# Patient Record
Sex: Female | Born: 1965 | Race: Black or African American | Hispanic: No | Marital: Single | Smoking: Current some day smoker
Health system: Southern US, Community
[De-identification: ages and names within clinical notes are randomized; demographics above are authoritative.]

## PROBLEM LIST (undated history)

## (undated) DIAGNOSIS — D649 Anemia, unspecified: Secondary | ICD-10-CM

## (undated) DIAGNOSIS — I10 Essential (primary) hypertension: Secondary | ICD-10-CM

## (undated) HISTORY — PX: ABDOMINAL HYSTERECTOMY: SHX81

---

## 1985-12-04 HISTORY — PX: TUBAL LIGATION: SHX77

## 2004-10-01 ENCOUNTER — Emergency Department: Payer: Self-pay | Admitting: Emergency Medicine

## 2004-10-05 ENCOUNTER — Emergency Department: Payer: Self-pay | Admitting: Emergency Medicine

## 2005-03-22 ENCOUNTER — Emergency Department: Payer: Self-pay | Admitting: Emergency Medicine

## 2006-12-15 ENCOUNTER — Emergency Department: Payer: Self-pay | Admitting: Emergency Medicine

## 2007-05-09 ENCOUNTER — Emergency Department: Payer: Self-pay | Admitting: Emergency Medicine

## 2007-09-23 ENCOUNTER — Other Ambulatory Visit: Payer: Self-pay

## 2007-09-23 ENCOUNTER — Emergency Department: Payer: Self-pay | Admitting: Emergency Medicine

## 2007-09-24 ENCOUNTER — Ambulatory Visit: Payer: Self-pay | Admitting: Emergency Medicine

## 2009-03-19 ENCOUNTER — Emergency Department: Payer: Self-pay | Admitting: Emergency Medicine

## 2012-02-06 ENCOUNTER — Emergency Department: Payer: Self-pay | Admitting: Emergency Medicine

## 2012-10-20 ENCOUNTER — Emergency Department: Payer: Self-pay | Admitting: Unknown Physician Specialty

## 2012-10-20 ENCOUNTER — Emergency Department: Payer: Self-pay | Admitting: Emergency Medicine

## 2015-01-17 DIAGNOSIS — F17210 Nicotine dependence, cigarettes, uncomplicated: Secondary | ICD-10-CM | POA: Insufficient documentation

## 2015-03-18 ENCOUNTER — Inpatient Hospital Stay
Admit: 2015-03-18 | Disposition: A | Discharge status: Home / Self Care | Payer: Self-pay | Attending: Internal Medicine | Admitting: Internal Medicine

## 2015-03-18 DIAGNOSIS — I361 Nonrheumatic tricuspid (valve) insufficiency: Secondary | ICD-10-CM

## 2015-03-18 LAB — COMPREHENSIVE METABOLIC PANEL
ALBUMIN: 3.9 g/dL
ALK PHOS: 72 U/L
ALT: 9 U/L — AB
ANION GAP: 6 — AB (ref 7–16)
BILIRUBIN TOTAL: 0.6 mg/dL
BUN: 9 mg/dL
CREATININE: 0.56 mg/dL
Calcium, Total: 8.7 mg/dL — ABNORMAL LOW
Chloride: 105 mmol/L
Co2: 28 mmol/L
EGFR (African American): >60
EGFR (Non-African Amer.): >60
GLUCOSE: 96 mg/dL
Potassium: 3.4 mmol/L — ABNORMAL LOW
SGOT(AST): 9 U/L — ABNORMAL LOW
SODIUM: 139 mmol/L
TOTAL PROTEIN: 7.4 g/dL

## 2015-03-18 LAB — URINALYSIS, COMPLETE
Bilirubin,UR: NEGATIVE
Glucose,UR: NEGATIVE mg/dL (ref 0–75)
Ketone: NEGATIVE
LEUKOCYTE ESTERASE: NEGATIVE
Nitrite: NEGATIVE
PH: 7 (ref 4.5–8.0)
PROTEIN: NEGATIVE
Specific Gravity: 1.006 (ref 1.003–1.030)

## 2015-03-18 LAB — CBC
HCT: 38.2 % (ref 35.0–47.0)
HGB: 12.4 g/dL (ref 12.0–16.0)
MCH: 28.9 pg (ref 26.0–34.0)
MCHC: 32.5 g/dL (ref 32.0–36.0)
MCV: 89 fL (ref 80–100)
Platelet: 244 10*3/uL (ref 150–440)
RBC: 4.3 10*6/uL (ref 3.80–5.20)
RDW: 14.7 % — AB (ref 11.5–14.5)
WBC: 5.7 10*3/uL (ref 3.6–11.0)

## 2015-03-18 LAB — PROTIME-INR
INR: 1
Prothrombin Time: 13.3 secs

## 2015-03-18 LAB — MAGNESIUM: Magnesium: 1.8 mg/dL

## 2015-03-18 LAB — APTT: ACTIVATED PTT: 29.5 s (ref 23.6–35.9)

## 2015-03-19 LAB — TSH: Thyroid Stimulating Horm: 1.855 u[IU]/mL

## 2015-03-19 LAB — LIPID PANEL
CHOLESTEROL: 156 mg/dL
HDL Cholesterol: 51 mg/dL
Ldl Cholesterol, Calc: 72 mg/dL
Triglycerides: 167 mg/dL — ABNORMAL HIGH
VLDL CHOLESTEROL, CALC: 33 mg/dL

## 2015-03-19 LAB — HEMOGLOBIN A1C: HEMOGLOBIN A1C: 5.5 %

## 2015-04-04 NOTE — H&P (Signed)
PATIENT NAME:  Melissa Sparks, Melissa Sparks MR#:  [id number] DATE OF BIRTH:  11/21/1966  DATE OF ADMISSION:  03/18/2015  PRIMARY CARE PHYSICIAN:  None.  REFERRING PHYSICIAN:  Dr. Jacqualine Code.  CHIEF COMPLAINT:   Slurred speech, left-sided weakness.   HISTORY OF PRESENT ILLNESS:  A 49 year old African-American female with no past medical history presented to the ED with the above chief complaint.  The patient is alert, awake, oriented, in no acute distress.  The patient started to have slurred speech, left-sided weakness and numbness and tingling this morning, so she came to the ED for further evaluation.  The patient's symptoms better were better after she arrived in the ED but still had left side numbness and tingling.  The patient said that she also had headache but denies any dizziness, or incontinence, no loss of consciousness or seizure.  The patient denies any other symptoms.   PAST MEDICAL HISTORY:  None.    SOCIAL HISTORY:  Smokes 2 packs a day for many years.  Denies any alcohol drinking or illicit drugs.   FAMILY HISTORY:  Hypertension, diabetes in her parents and CVA and MI in her family.    PAST SURGICAL HISTORY:  None.    ALLERGIES:  NONE.    HOME MEDICATIONS:  Meloxicam 15 mg p.o. daily, Tylenol 500 mg 3 tablets every four hours p.r.n.   REVIEW OF SYSTEMS:   CONSTITUTIONAL:  The patient denies any fever or chills.  No but has headache.  No dizziness.  No generalized weakness.  HEENT:  Eyes - positive for double vision and blurred vision.  Positive for slurred speech and mild dysphasia.  No postnasal drip.   CARDIOVASCULAR:  No chest pain, palpitation, orthopnea, or nocturnal dyspnea.  No leg edema.  PULMONARY:  No cough, sputum, shortness of breath, or hematemesis.  GASTROINTESTINAL:  No abdominal pain, nausea, vomiting, or diarrhea.  No melena or bloody stool.  GENITOURINARY:  No dysuria, hematuria, or incontinence.  SKIN:  No rash or jaundice.  NEUROLOGIC:  Positive for slurred speech  and mild dysphasia and left-sided weakness, numbness and tingling.  No syncope, loss of consciousness, or seizure.  ENDOCRINOLOGY:  No polyuria, polydipsia, heat or cold intolerance.  HEMATOLOGY:  No easy bruising or bleeding.   PHYSICAL EXAMINATION:  VITAL SIGNS:  Temperature 98.7, blood pressure was 200/101 and decreased to 181/104, pulse 67, oxygen saturation 100% on room air.  GENERAL:  The patient is alert, awake, oriented, in no acute distress.  HEENT:  Pupils round, equal and react to light and accommodation.  Moist oral mucosa. Clear pharynx.  NECK:  Supple.  No JVD or carotid bruit.  No lymphadenopathy.  No thyromegaly.  CARDIOVASCULAR:  S1 and S2, regular rate and rhythm.  No murmurs or gallops.   PULMONARY:  Bilateral air entry.  No wheezing or rales.  No use of accessory muscle to breathe.  ABDOMEN:  Soft.  No distention or tenderness.  No organomegaly.  Bowel sounds present.  EXTREMITIES:  No edema, clubbing or cyanosis. No calf tenderness.  Bilateral pedal pulses present.  SKIN:  No rash or jaundice.  NEUROLOGY:  Alert and oriented x 3.  No focal deficit.  Power 5/5.  Sensation intact but the patient complains of left side numbness.   LABORATORY DATA:  CAT scan of head:  No intracranial mass, hemorrhage or acute infection.  Urinalysis is negative.  CBC in normal range.  Glucose 96, BUN 9, creatinine 0.56. Electrolytes are normal except potassium 3.4.  EKG showed normal  sinus rhythm at 79 BPM.   PLAN OF TREATMENT:  1.  The patient will be admitted to telemetry floor.  The patient was treated with aspirin in the ED.  I will continue aspirin, start a statin and follow up MRI of brain, echocardiograph, and carotid duplex.  2.  I will check a lipid panel and hemoglobin A1c.   3.  For tobacco abuse, the patient consulted for smoking cessation.  The patient does not want a nicotine patch but she wants a nicotine oral inhaler kit.   4.  The patient also mentioned that she probably had an  ear mass.  ENT physician suggested an MRI of brain as an outpatient.  We will follow up MRI of brain to rule out brain mass.   I discussed the patient's condition and plan of treatment with the patient and the patient's husband and mother.   TIME SPENT:  About 55 minutes.     ____________________________ Demetrios Loll, MD qc:NT D: 03/18/2015 19:33:06 ET T: 03/18/2015 20:48:06 ET JOB#: [id number]  cc: Demetrios Loll, MD, <Dictator> Demetrios Loll MD ELECTRONICALLY SIGNED 03/19/2015 15:21

## 2015-04-04 NOTE — Discharge Summary (Signed)
PATIENT NAME:  Melissa Sparks, Melissa Sparks MR#:  [id number] DATE OF BIRTH:  12-09-1965  DATE OF ADMISSION:  03/18/2015 DATE OF DISCHARGE:  03/19/2015  ADMISSION COMPLAINT: Slurred speech and left-sided weakness.   DISCHARGE DIAGNOSES: 1. Left-sided weakness, now resolved.  2. Left-sided hearing loss.  3. Empty sella seen on MRI, nonpathologic.  4. Ongoing tobacco abuse.  5. Hypertension.   CONSULTATIONS: None.   PROCEDURES: 1. CT scan of the head without contrast shows no intracranial mass, hemorrhage or focal gray-white compartment lesions. No acute appearing infarct. Mastoids appear clear.  2. MRI of the brain without contrast shows no evidence for acute infarction, hemorrhage, mass lesion, hydrocephalus or extra-axial fluid. Normal cerebral volume. No appreciable white matter disease. Flow voids are maintained throughout the carotid, basilar and vertebral arteries. There are no areas of chronic hemorrhage. There is marked empty sella with expansion. The gland is markedly flattened. Slight tethering of the optic chiasm. Normal pineal and cerebellar tonsils. Extracranial soft tissues are on remarkable for.  3. Bilateral carotid Dopplers shows velocities and wave forms within normal limits for the right carotid and the left carotid. Study is normal with no evidence of carotid stenosis in the neck.  4. A 2-D echocardiogram showed left ventricular ejection fraction of 50% to 55%, low normal global left ventricular systolic function, mild left hypertrophy, normal right ventricular size and systolic function, mild tricuspid regurgitation, normal right ventricular systolic pressures.   HISTORY OF PRESENT ILLNESS: This 49 year old, African-American woman with no past medical history presents to the Emergency Room with slurred speech and left-sided weakness. She is awake, oriented, alert and in no distress at the time of examination. The patient started to have slurred speech and left-sided weakness and tingling  the morning of admission. Her symptoms had improved by the time she arrived to the ED but she still had left-sided numbness and tingling. She also had a headache but no dizziness, incontinence or loss of consciousness, no seizure-like activity.   HOSPITAL COURSE: By problem: 1. Slurred speech and left-sided weakness: The patient was initially admitted for concerns of stroke. CT and MRI of the brain are normal. Carotid Dopplers are normal. A 2-D echocardiogram also normal. Her imaging was discussed with neurology, though neurology did not formally consult and given that she was asymptomatic on the day of discharge and all imaging was normal, it was determined that she may have had a TIA or migraine and secondary risk factor reduction would be important going forward. She was started on lisinopril and metoprolol for blood pressure control. She was advised to stop smoking. Her lipid panel was excellent so no statin medications were started. She needs to find a primary care physician and may need to consider aspirin 8 1mg  in the future.  2. Hypertension: On presentation, she was very hypertensive with an initial blood pressure of 200/100. She was started on lisinopril and metoprolol and by the time of discharge, blood pressures were well controlled.  3. Ongoing tobacco abuse: Smoking cessation counseling was provided each day during her admission. The patient reports that she is ready to quit smoking.  4. Left-sided hearing loss: The patient will follow up with her ENT who had been evaluating left-sided hearing loss. There was actually an MRI scheduled to look for possible acoustic neuroma. Unfortunately, the MRI study performed during this admission would not replace that study and she will need to continue to follow up with her EENT.  5. Empty sella: She does not exhibit any signs of panhypopituitarism.  This is likely an incidental finding on imaging to look for cause of her symptoms. This is not likely the  cause of her symptoms. There is likely no pathologic implication at this point of the empty sella.   DISCHARGE PHYSICAL EXAMINATION: VITAL SIGNS: Temperature 98.1, pulse 74, respirations 20, blood pressure 162/85, oxygenation 99% on room air.  GENERAL: No acute distress.  CARDIOVASCULAR: Regular rate and rhythm. No murmurs, rubs, or gallops.  RESPIRATORY: Lungs are clear to auscultation bilaterally with good air movement.  NEUROLOGIC: Cranial nerves II through XII grossly intact. Strength and sensation intact. Nonfocal neurologic examination.  PSYCHIATRIC: She is alert and oriented with good insight into her clinical condition.   LABORATORY DATA: Sodium 137, potassium 3.4. This was repleted prior to discharge. Chloride 105, bicarbonate 28, BUN 9, creatinine 0.56, glucose 96, calcium 8.7, magnesium 1.8. Total cholesterol 156, LDL is 72, HDL 51, triglycerides 67, nonfasting. Hemoglobin A1c 5.5. LFTs normal. TSH 1.85. White blood cells 5.7, hemoglobin 12.4, platelets 244,000, MCV is 89. UA negative for signs of infection   CONDITION ON DISCHARGE: Stable.   DISCHARGE MEDICATIONS: 1. Meloxicam 15 mg 1 tablet once a day in the morning.  2. Tylenol 500 mg 3 tablets every 4 hours as needed for pain.  3. Lisinopril 10 mg 1 tablet daily.  4. Metoprolol tartrate 25 mg 1 tablet twice a day.   DISPOSITION: The patient is being discharged to home with no further home health needs.   DIET: Low-sodium, low-fat, low-cholesterol diet.   ACTIVITY: No limitations. Physical activity is encouraged.   TIME FRAME FOR FOLLOW-UP APPOINTMENT: Primary care physician within the next 1 to 2 weeks.   ADDITIONAL DISCHARGE INSTRUCTIONS: You must stop smoking to improve your health.   TIME SPENT ON DISCHARGE: 40 minutes.     ____________________________ Earleen Newport. Volanda Napoleon, MD cpw:TT D: 03/27/2015 16:21:31 ET T: 03/27/2015 16:36:21 ET JOB#: [id number]  cc: Barnetta Chapel P. Volanda Napoleon, MD, <Dictator> Aldean Jewett  MD ELECTRONICALLY SIGNED 04/01/2015 7:34

## 2015-05-27 ENCOUNTER — Ambulatory Visit: Payer: BLUE CROSS/BLUE SHIELD | Attending: Neurology

## 2015-05-27 DIAGNOSIS — R0683 Snoring: Secondary | ICD-10-CM | POA: Diagnosis present

## 2015-05-27 DIAGNOSIS — G4733 Obstructive sleep apnea (adult) (pediatric): Secondary | ICD-10-CM | POA: Diagnosis not present

## 2015-06-08 ENCOUNTER — Encounter: Payer: Self-pay | Admitting: *Deleted

## 2015-06-08 ENCOUNTER — Other Ambulatory Visit: Payer: BLUE CROSS/BLUE SHIELD

## 2015-06-08 NOTE — Patient Instructions (Signed)
  Your procedure is scheduled on: 06-11-15 Report to [facility] To find out your arrival time please call [phone] between 1PM - 3PM on 06-10-15 (THURSDAY)  Remember: Instructions that are not followed completely may result in serious medical risk, up to and including death, or upon the discretion of your surgeon and anesthesiologist your surgery may need to be rescheduled.    _X___ 1. Do not eat food or drink liquids after midnight. No gum chewing or hard candies.     _X___ 2. No Alcohol for 24 hours before or after surgery.   ____ 3. Bring all medications with you on the day of surgery if instructed.    _X___ 4. Notify your doctor if there is any change in your medical condition     (cold, fever, infections).     Do not wear jewelry, make-up, hairpins, clips or nail polish.  Do not wear lotions, powders, or perfumes. You may wear deodorant.  Do not shave 48 hours prior to surgery. Men may shave face and neck.  Do not bring valuables to the hospital.    [facility] is not responsible for any belongings or valuables.               Contacts, dentures or bridgework may not be worn into surgery.  Leave your suitcase in the car. After surgery it may be brought to your room.  For patients admitted to the hospital, discharge time is determined by your  treatment team.   Patients discharged the day of surgery will not be allowed to drive home.   Please read over the following fact sheets that you were given:    _X___ Take these medicines the morning of surgery with A SIP OF WATER:    1. METOPROLOL  2.   3.   4.  5.  6.  ____ Fleet Enema (as directed)   ____ Use CHG Soap as directed  ____ Use inhalers on the day of surgery  ____ Stop metformin 2 days prior to surgery    ____ Take 1/2 of usual insulin dose the night before surgery and none on the morning of surgery.   __X__ Stop Coumadin/Plavix/aspirin-STOP ASPIRIN NOW  __X__ Stop  Anti-inflammatories-STOP MELOXICAM NOW-NO NSAIDS OR ASPIRIN PRODUCTS-TYLENOL OK   ____ Stop supplements until after surgery.    ____ Bring C-Pap to the hospital.

## 2015-06-10 ENCOUNTER — Encounter
Admission: RE | Admit: 2015-06-10 | Discharge: 2015-06-10 | Disposition: A | Discharge status: Home / Self Care | Payer: BLUE CROSS/BLUE SHIELD | Source: Ambulatory Visit | Attending: Obstetrics & Gynecology | Admitting: Obstetrics & Gynecology

## 2015-06-10 DIAGNOSIS — A599 Trichomoniasis, unspecified: Secondary | ICD-10-CM | POA: Diagnosis not present

## 2015-06-10 DIAGNOSIS — I10 Essential (primary) hypertension: Secondary | ICD-10-CM | POA: Diagnosis not present

## 2015-06-10 DIAGNOSIS — Z7982 Long term (current) use of aspirin: Secondary | ICD-10-CM | POA: Diagnosis not present

## 2015-06-10 DIAGNOSIS — Z87891 Personal history of nicotine dependence: Secondary | ICD-10-CM | POA: Diagnosis not present

## 2015-06-10 DIAGNOSIS — D259 Leiomyoma of uterus, unspecified: Secondary | ICD-10-CM | POA: Diagnosis not present

## 2015-06-10 DIAGNOSIS — Z9851 Tubal ligation status: Secondary | ICD-10-CM | POA: Diagnosis not present

## 2015-06-10 DIAGNOSIS — M069 Rheumatoid arthritis, unspecified: Secondary | ICD-10-CM | POA: Diagnosis not present

## 2015-06-10 DIAGNOSIS — Z79899 Other long term (current) drug therapy: Secondary | ICD-10-CM | POA: Diagnosis not present

## 2015-06-10 DIAGNOSIS — N920 Excessive and frequent menstruation with regular cycle: Secondary | ICD-10-CM | POA: Diagnosis not present

## 2015-06-10 LAB — CBC
HCT: 36.7 % (ref 35.0–47.0)
Hemoglobin: 12.3 g/dL (ref 12.0–16.0)
MCH: 29.1 pg (ref 26.0–34.0)
MCHC: 33.5 g/dL (ref 32.0–36.0)
MCV: 87 fL (ref 80.0–100.0)
PLATELETS: 229 10*3/uL (ref 150–440)
RBC: 4.22 MIL/uL (ref 3.80–5.20)
RDW: 13.6 % (ref 11.5–14.5)
WBC: 5.2 10*3/uL (ref 3.6–11.0)

## 2015-06-10 LAB — POTASSIUM: Potassium: 3.5 mmol/L (ref 3.5–5.1)

## 2015-06-10 LAB — TYPE AND SCREEN
ABO/RH(D): O POS
Antibody Screen: NEGATIVE

## 2015-06-10 LAB — PREGNANCY, URINE: PREG TEST UR: NEGATIVE

## 2015-06-11 ENCOUNTER — Ambulatory Visit: Payer: BLUE CROSS/BLUE SHIELD | Admitting: Anesthesiology

## 2015-06-11 ENCOUNTER — Ambulatory Visit
Admission: RE | Admit: 2015-06-11 | Discharge: 2015-06-11 | Disposition: A | Discharge status: Home / Self Care | Payer: BLUE CROSS/BLUE SHIELD | Source: Ambulatory Visit | Attending: Obstetrics & Gynecology | Admitting: Obstetrics & Gynecology

## 2015-06-11 ENCOUNTER — Encounter
Admission: RE | Disposition: A | Discharge status: Home / Self Care | Payer: Self-pay | Source: Ambulatory Visit | Attending: Obstetrics & Gynecology

## 2015-06-11 DIAGNOSIS — N920 Excessive and frequent menstruation with regular cycle: Secondary | ICD-10-CM | POA: Insufficient documentation

## 2015-06-11 DIAGNOSIS — Z9851 Tubal ligation status: Secondary | ICD-10-CM | POA: Insufficient documentation

## 2015-06-11 DIAGNOSIS — M069 Rheumatoid arthritis, unspecified: Secondary | ICD-10-CM | POA: Insufficient documentation

## 2015-06-11 DIAGNOSIS — A599 Trichomoniasis, unspecified: Secondary | ICD-10-CM | POA: Insufficient documentation

## 2015-06-11 DIAGNOSIS — Z79899 Other long term (current) drug therapy: Secondary | ICD-10-CM | POA: Insufficient documentation

## 2015-06-11 DIAGNOSIS — Z7982 Long term (current) use of aspirin: Secondary | ICD-10-CM | POA: Insufficient documentation

## 2015-06-11 DIAGNOSIS — D259 Leiomyoma of uterus, unspecified: Secondary | ICD-10-CM | POA: Insufficient documentation

## 2015-06-11 DIAGNOSIS — I10 Essential (primary) hypertension: Secondary | ICD-10-CM | POA: Insufficient documentation

## 2015-06-11 DIAGNOSIS — Z87891 Personal history of nicotine dependence: Secondary | ICD-10-CM | POA: Insufficient documentation

## 2015-06-11 HISTORY — DX: Essential (primary) hypertension: I10

## 2015-06-11 HISTORY — DX: Anemia, unspecified: D64.9

## 2015-06-11 HISTORY — PX: HYSTEROSCOPY WITH D & C: SHX1775

## 2015-06-11 SURGERY — DILATATION AND CURETTAGE /HYSTEROSCOPY
Anesthesia: General | Wound class: Clean Contaminated

## 2015-06-11 MED ORDER — FAMOTIDINE 20 MG PO TABS
ORAL_TABLET | ORAL | Status: DC
Start: 2015-06-11 — End: 2015-06-11
  Filled 2015-06-11: qty 1

## 2015-06-11 MED ORDER — ONDANSETRON HCL 4 MG/2ML IJ SOLN
INTRAMUSCULAR | Status: DC | PRN
  Administered 2015-06-11: 4 mg via INTRAVENOUS

## 2015-06-11 MED ORDER — LACTATED RINGERS IV SOLN
INTRAVENOUS | Status: DC
  Administered 2015-06-11 (×3): via INTRAVENOUS

## 2015-06-11 MED ORDER — FAMOTIDINE 20 MG PO TABS
20.0000 mg | ORAL_TABLET | Freq: Once | ORAL | Status: AC
  Administered 2015-06-11: 20 mg via ORAL

## 2015-06-11 MED ORDER — PROPOFOL 10 MG/ML IV BOLUS
INTRAVENOUS | Status: DC | PRN
  Administered 2015-06-11: 200 mg via INTRAVENOUS

## 2015-06-11 MED ORDER — DEXAMETHASONE SODIUM PHOSPHATE 10 MG/ML IJ SOLN
INTRAMUSCULAR | Status: DC | PRN
  Administered 2015-06-11: 10 mg via INTRAVENOUS

## 2015-06-11 MED ORDER — ONDANSETRON HCL 4 MG/2ML IJ SOLN: 4.0000 mg | Freq: Once | INTRAMUSCULAR | Status: DC | PRN

## 2015-06-11 MED ORDER — FENTANYL CITRATE (PF) 100 MCG/2ML IJ SOLN
INTRAMUSCULAR | Status: AC
  Filled 2015-06-11: qty 2

## 2015-06-11 MED ORDER — DEXMEDETOMIDINE HCL 200 MCG/2ML IV SOLN
INTRAVENOUS | Status: DC | PRN
  Administered 2015-06-11: 12 ug via INTRAVENOUS

## 2015-06-11 MED ORDER — HYDROMORPHONE HCL 1 MG/ML IJ SOLN: 0.2500 mg | INTRAMUSCULAR | Status: DC | PRN

## 2015-06-11 MED ORDER — METRONIDAZOLE IVPB CUSTOM: 1.0000 g | Freq: Once | INTRAVENOUS | Status: DC

## 2015-06-11 MED ORDER — GLYCOPYRROLATE 0.2 MG/ML IJ SOLN
INTRAMUSCULAR | Status: DC | PRN
  Administered 2015-06-11: 0.2 mg via INTRAVENOUS

## 2015-06-11 MED ORDER — FENTANYL CITRATE (PF) 100 MCG/2ML IJ SOLN
INTRAMUSCULAR | Status: DC | PRN
  Administered 2015-06-11 (×4): 25 ug via INTRAVENOUS

## 2015-06-11 MED ORDER — LIDOCAINE HCL (CARDIAC) 20 MG/ML IV SOLN
INTRAVENOUS | Status: DC | PRN
  Administered 2015-06-11: 100 mg via INTRAVENOUS

## 2015-06-11 MED ORDER — METRONIDAZOLE IN NACL 5-0.79 MG/ML-% IV SOLN
500.0000 mg | INTRAVENOUS | Status: AC
  Filled 2015-06-11 (×2): qty 100

## 2015-06-11 MED ORDER — METRONIDAZOLE IN NACL 5-0.79 MG/ML-% IV SOLN
INTRAVENOUS | Status: DC | PRN
  Administered 2015-06-11: 1000 mg via INTRAVENOUS

## 2015-06-11 MED ORDER — IBUPROFEN 600 MG PO TABS: 600.0000 mg | ORAL_TABLET | Freq: Four times a day (QID) | ORAL | Status: DC | PRN

## 2015-06-11 MED ORDER — MIDAZOLAM HCL 5 MG/5ML IJ SOLN
INTRAMUSCULAR | Status: DC | PRN
  Administered 2015-06-11: 2 mg via INTRAVENOUS

## 2015-06-11 MED ORDER — FENTANYL CITRATE (PF) 100 MCG/2ML IJ SOLN
25.0000 ug | INTRAMUSCULAR | Status: DC | PRN
  Administered 2015-06-11 (×4): 25 ug via INTRAVENOUS

## 2015-06-11 SURGICAL SUPPLY — 13 items
CANISTER SUCT 3000ML (MISCELLANEOUS) ×4 IMPLANT
CATH ROBINSON RED A/P 16FR (CATHETERS) ×4 IMPLANT
GLOVE BIOGEL PI IND STRL 6.5 (GLOVE) ×6 IMPLANT
GLOVE BIOGEL PI INDICATOR 6.5 (GLOVE) ×6
GLOVE SURG SYN 6.5 ES PF (GLOVE) ×4 IMPLANT
GOWN STRL REUS W/ TWL LRG LVL3 (GOWN DISPOSABLE) ×4 IMPLANT
GOWN STRL REUS W/TWL LRG LVL3 (GOWN DISPOSABLE) ×4
IV LACTATED RINGERS 1000ML (IV SOLUTION) ×4 IMPLANT
NS IRRIG 500ML POUR BTL (IV SOLUTION) ×4 IMPLANT
PACK DNC HYST (MISCELLANEOUS) ×4 IMPLANT
PAD OB MATERNITY 4.3X12.25 (PERSONAL CARE ITEMS) ×4 IMPLANT
TUBING CONNECTING 10 (TUBING) ×3 IMPLANT
TUBING CONNECTING 10' (TUBING) ×1

## 2015-06-11 NOTE — Anesthesia Preprocedure Evaluation (Addendum)
Anesthesia Evaluation  Patient identified by MRN, date of birth, ID band Patient awake    Reviewed: Allergy & Precautions, NPO status , Patient's Chart, lab work & pertinent test results, reviewed documented beta blocker date and time   History of Anesthesia Complications Negative for: history of anesthetic complications  Airway Mallampati: II  TM Distance: >3 FB Neck ROM: Full    Dental no notable dental hx.    Pulmonary neg pulmonary ROS, former smoker,  breath sounds clear to auscultation  Pulmonary exam normal       Cardiovascular Exercise Tolerance: Good hypertension, Pt. on medications and Pt. on home beta blockers Normal cardiovascular examRhythm:Regular Rate:Normal     Neuro/Psych negative neurological ROS  negative psych ROS   GI/Hepatic negative GI ROS, Neg liver ROS,   Endo/Other  negative endocrine ROS  Renal/GU negative Renal ROS  negative genitourinary   Musculoskeletal negative musculoskeletal ROS (+)   Abdominal   Peds negative pediatric ROS (+)  Hematology  (+) anemia ,   Anesthesia Other Findings   Reproductive/Obstetrics negative OB ROS                            Anesthesia Physical Anesthesia Plan  ASA: III  Anesthesia Plan: General   Post-op Pain Management:    Induction: Intravenous  Airway Management Planned: LMA  Additional Equipment:   Intra-op Plan:   Post-operative Plan: Extubation in OR  Informed Consent: I have reviewed the patients History and Physical, chart, labs and discussed the procedure including the risks, benefits and alternatives for the proposed anesthesia with the patient or authorized representative who has indicated his/her understanding and acceptance.   Dental advisory given  Plan Discussed with: CRNA and Surgeon  Anesthesia Plan Comments:         Anesthesia Quick Evaluation

## 2015-06-11 NOTE — Op Note (Addendum)
Operative Report Hysteroscopy, Dilation and Curettage 06/11/2015  Patient:  Melissa Sparks  49 y.o. female Preoperative diagnosis:   FIBROID UTERUS AND MENORRHAGIA Postoperative diagnosis:  fibroid uterus and menorrhagia  PROCEDURE:  Procedure(s): DILATATION & CURETTAGE/HYSTEROSCOPY  Surgeon:  Surgeon(s) and Role:    * Izela Altier C Chanler Mendonca, MD - Primary Assistant: none Anesthesia:  LMA I/O:  IN: 1000cc crystalloid, 1000mg  Flagyl, OUT: 100cc EBL, no UOP  Specimens:  Endometrial curettings Complications: None Apparent Disposition:  VS stable to PACU  Findings: Uterus, mobile, 14 week size, sounding to 14 cm; normal but large cervix, vagina, perineum.  Evidence of continued presence of trichomonas.  Indication for procedure/Consents: 49 y.o.  here for scheduled surgery for the aforementioned diagnoses.  Risks of surgery were discussed with the patient including but not limited to: bleeding which may require transfusion; infection which may require antibiotics; injury to uterus or surrounding organs; intrauterine scarring which may impair future fertility; need for additional procedures including laparotomy or laparoscopy; and other postoperative/anesthesia complications. Written informed consent was obtained.    Procedure Details:   The patient was then taken to the operating room where anesthesia was administered and was found to be adequate.  After a formal and adequate timeout was performed, she was placed in the dorsal lithotomy position and examined with the above findings. She was then prepped and draped in the sterile manner.  A speculum was then placed in the patient's vagina.  The cerivx had a gray/tan discharge on its surface and in the vaginal apex.  Betadine was used to copiously clean and irrigate the cervix and vagina.  The patient was started on Flagyl 1000mg  IV during the case. Once the vagina was cleaned, a single tooth tenaculum was applied to the anterior lip of the cervix.    The uterus was sounded to 14cm. Her cervix was serially dilated to accommodate the hysteroscope, with findings as above. A sharp curettage was then performed until there was a gritty texture in all four quadrants. The specimen was handed off to nursing.  The camera was reinserted and confirmed the uterus had been evacuated. The tenaculum was removed from the anterior lip of the cervix and the vaginal speculum was removed after noting good hemostasis. The patient tolerated the procedure well and was taken to the recovery area awake, extubated and in stable condition.  The patient will be discharged to home as per PACU criteria.  Routine postoperative instructions given. She will follow up in the clinic in two to four weeks for postoperative evaluation.  Larey Days, MD [facility] OBGYN Attending Gynecologist

## 2015-06-11 NOTE — Progress Notes (Signed)
H&P Update  Pt was last seen in my office, and complete history and physical performed.  The surgical history has been reviewed and remains accurate without interval change. The patient was re-examined and patient's physiologic condition has not changed significantly in the last 30 days.  No new pharmacological allergies or types of therapy has been initiated.  Allergies  Allergen Reactions  . Latex Rash    SENSITIVITY   @CURRENTMEDS @ Past Medical History  Diagnosis Date  . Hypertension   . Anemia    Past Surgical History  Procedure Laterality Date  . Tubal ligation  1987    BP 121/71 mmHg  Pulse 77  Temp(Src) 98.3 F (36.8 C) (Oral)  Resp 16  Ht 5\' 7"  (1.702 m)  Wt 111.585 kg (246 lb)  BMI 38.52 kg/m2  SpO2 100%  LMP 03/12/2015  NAD RRR no murmurs CTAB, no wheezing, resps unlabored +BS, soft, NTTP No c/c/e Pelvic exam deferred  The above history was confirmed with the patient. The condition still exists that makes this procedure necessary. Surgical plan includes D&C hysteroscopy as confirmed on the consent. The treatment plan remains the same, without new options for care.  The patient understands the potential benefits and risks and the consents have been signed and placed on the chart.     Larey Days, MD Attending Obstetrician Gynecologist [facility]

## 2015-06-11 NOTE — Transfer of Care (Signed)
Immediate Anesthesia Transfer of Care Note  Patient: Melissa Sparks  Procedure(s) Performed: Procedure(s): DILATATION & CURETTAGE/HYSTEROSCOPY WITH NOVASURE ABLATION (N/A)  Patient Location: PACU  Anesthesia Type:General  Level of Consciousness: alert   Airway & Oxygen Therapy: Patient Spontanous Breathing and Patient connected to face mask oxygen  Post-op Assessment: Report given to RN and Post -op Vital signs reviewed and stable  Post vital signs: Reviewed and stable  Last Vitals:  Filed Vitals:   06/11/15 1108  BP: 121/71  Pulse: 77  Temp: 36.8 C  Resp: 16    Complications: No apparent anesthesia complications

## 2015-06-11 NOTE — Anesthesia Postprocedure Evaluation (Signed)
  Anesthesia Post-op Note  Patient: Melissa Sparks  Procedure(s) Performed: Procedure(s): DILATATION AND CURETTAGE /HYSTEROSCOPY  Anesthesia type:General  Patient location: PACU  Post pain: Pain level controlled  Post assessment: Post-op Vital signs reviewed, Patient's Cardiovascular Status Stable, Respiratory Function Stable, Patent Airway and No signs of Nausea or vomiting  Post vital signs: Reviewed and stable  Last Vitals:  Filed Vitals:   06/11/15 1416  BP: 117/64  Pulse: 60  Temp:   Resp: 13    Level of consciousness: awake, alert  and patient cooperative  Complications: No apparent anesthesia complications

## 2015-06-11 NOTE — Discharge Instructions (Signed)
You should expect to have some cramping and vaginal bleeding for about a week. This should taper off and subside, much like a period. If heavy bleeding continues or gets worse, you should contact the office for an earlier appointment.   Please call the office or physician on call for fever >101, severe pain, and heavy bleeding.   [phone]   NOTHING IN THE VAGINA FOR 2 WEEKS!  AMBULATORY SURGERY  DISCHARGE INSTRUCTIONS   1) The drugs that you were given will stay in your system until tomorrow so for the next 24 hours you should not:  A) Drive an automobile B) Make any legal decisions C) Drink any alcoholic beverage   2) You may resume regular meals tomorrow.  Today it is better to start with liquids and gradually work up to solid foods.  You may eat anything you prefer, but it is better to start with liquids, then soup and crackers, and gradually work up to solid foods.   3) Please notify your doctor immediately if you have any unusual bleeding, trouble breathing, redness and pain at the surgery site, drainage, fever, or pain not relieved by medication.    4) Additional Instructions:  Please contact your physician with any problems or Same Day Surgery at [phone], Monday through Friday 6 am to 4 pm, or [place] at [facility] number at [phone].

## 2015-06-16 LAB — ABO/RH: ABO/RH(D): O POS

## 2015-06-16 LAB — SURGICAL PATHOLOGY

## 2015-06-24 ENCOUNTER — Ambulatory Visit: Payer: BLUE CROSS/BLUE SHIELD | Attending: Neurology

## 2015-06-28 ENCOUNTER — Encounter: Payer: Self-pay | Admitting: *Deleted

## 2015-06-28 ENCOUNTER — Other Ambulatory Visit: Payer: BLUE CROSS/BLUE SHIELD

## 2015-06-28 DIAGNOSIS — Z91040 Latex allergy status: Secondary | ICD-10-CM | POA: Diagnosis not present

## 2015-06-28 DIAGNOSIS — I10 Essential (primary) hypertension: Secondary | ICD-10-CM | POA: Diagnosis not present

## 2015-06-28 DIAGNOSIS — M069 Rheumatoid arthritis, unspecified: Secondary | ICD-10-CM | POA: Diagnosis not present

## 2015-06-28 DIAGNOSIS — Z9889 Other specified postprocedural states: Secondary | ICD-10-CM | POA: Diagnosis not present

## 2015-06-28 DIAGNOSIS — N800 Endometriosis of uterus: Secondary | ICD-10-CM | POA: Diagnosis not present

## 2015-06-28 DIAGNOSIS — Z791 Long term (current) use of non-steroidal anti-inflammatories (NSAID): Secondary | ICD-10-CM | POA: Diagnosis not present

## 2015-06-28 DIAGNOSIS — Z79899 Other long term (current) drug therapy: Secondary | ICD-10-CM | POA: Diagnosis not present

## 2015-06-28 DIAGNOSIS — D252 Subserosal leiomyoma of uterus: Secondary | ICD-10-CM | POA: Diagnosis not present

## 2015-06-28 DIAGNOSIS — D259 Leiomyoma of uterus, unspecified: Secondary | ICD-10-CM | POA: Diagnosis present

## 2015-06-28 DIAGNOSIS — D251 Intramural leiomyoma of uterus: Secondary | ICD-10-CM | POA: Diagnosis not present

## 2015-06-28 DIAGNOSIS — Z7982 Long term (current) use of aspirin: Secondary | ICD-10-CM | POA: Diagnosis not present

## 2015-06-28 DIAGNOSIS — N802 Endometriosis of fallopian tube: Secondary | ICD-10-CM | POA: Diagnosis not present

## 2015-06-28 DIAGNOSIS — N939 Abnormal uterine and vaginal bleeding, unspecified: Secondary | ICD-10-CM | POA: Diagnosis not present

## 2015-06-28 NOTE — Patient Instructions (Signed)
  Your procedure is scheduled on:07/06/15 Report to Day [facility]OND FLOOR To find out your arrival time please call [phone] between 1PM - 3PM on 07/05/15  Remember: Instructions that are not followed completely may result in serious medical risk, up to and including death, or upon the discretion of your surgeon and anesthesiologist your surgery may need to be rescheduled.    __X__ 1. Do not eat food or drink liquids after midnight. No gum chewing or hard candies.     __X__ 2. No Alcohol for 24 hours before or after surgery.   ____ 3. Bring all medications with you on the day of surgery if instructed.    __X__ 4. Notify your doctor if there is any change in your medical condition     (cold, fever, infections).     Do not wear jewelry, make-up, hairpins, clips or nail polish.  Do not wear lotions, powders, or perfumes. You may wear deodorant.  Do not shave 48 hours prior to surgery. Men may shave face and neck.  Do not bring valuables to the hospital.    [facility] is not responsible for any belongings or valuables.               Contacts, dentures or bridgework may not be worn into surgery.  Leave your suitcase in the car. After surgery it may be brought to your room.  For patients admitted to the hospital, discharge time is determined by your                treatment team.   Patients discharged the day of surgery will not be allowed to drive home.   Please read over the following fact sheets that you were given:   Surgical Site Infection Prevention   ____ Take these medicines the morning of surgery with A SIP OF WATER:    1. METOPROLOL  2.   3.   4.  5.  6.  ____ Fleet Enema (as directed)   ____ Use CHG Soap as directed  ____ Use inhalers on the day of surgery  ____ Stop metformin 2 days prior to surgery    ____ Take 1/2 of usual insulin dose the night before surgery and none on the morning of surgery.   __X__ Stop Coumadin/Plavix/aspirin on  STOP  ASPIRIN NOW  ____ Stop Anti-inflammatories on STOP NOW   ____ Stop supplements until after surgery.    ____ Bring C-Pap to the hospital.

## 2015-06-28 NOTE — OR Nursing (Signed)
[facility] IN OR NOTIFIED PATIENT LATEX SENSITIVE WITH HISTORY OF RAST TEST

## 2015-06-29 ENCOUNTER — Other Ambulatory Visit: Payer: BLUE CROSS/BLUE SHIELD

## 2015-07-06 ENCOUNTER — Ambulatory Visit: Payer: BLUE CROSS/BLUE SHIELD | Admitting: Anesthesiology

## 2015-07-06 ENCOUNTER — Observation Stay
Admission: RE | Admit: 2015-07-06 | Discharge: 2015-07-07 | Disposition: A | Discharge status: Home / Self Care | Payer: BLUE CROSS/BLUE SHIELD | Source: Ambulatory Visit | Attending: Obstetrics & Gynecology | Admitting: Obstetrics & Gynecology

## 2015-07-06 ENCOUNTER — Encounter
Admission: RE | Disposition: A | Discharge status: Home / Self Care | Payer: Self-pay | Source: Ambulatory Visit | Attending: Obstetrics & Gynecology

## 2015-07-06 ENCOUNTER — Encounter: Payer: Self-pay | Admitting: *Deleted

## 2015-07-06 DIAGNOSIS — D259 Leiomyoma of uterus, unspecified: Principal | ICD-10-CM | POA: Insufficient documentation

## 2015-07-06 DIAGNOSIS — D252 Subserosal leiomyoma of uterus: Secondary | ICD-10-CM | POA: Insufficient documentation

## 2015-07-06 DIAGNOSIS — N939 Abnormal uterine and vaginal bleeding, unspecified: Secondary | ICD-10-CM | POA: Insufficient documentation

## 2015-07-06 DIAGNOSIS — Z791 Long term (current) use of non-steroidal anti-inflammatories (NSAID): Secondary | ICD-10-CM | POA: Insufficient documentation

## 2015-07-06 DIAGNOSIS — Z90710 Acquired absence of both cervix and uterus: Secondary | ICD-10-CM | POA: Diagnosis present

## 2015-07-06 DIAGNOSIS — Z79899 Other long term (current) drug therapy: Secondary | ICD-10-CM | POA: Insufficient documentation

## 2015-07-06 DIAGNOSIS — D251 Intramural leiomyoma of uterus: Secondary | ICD-10-CM | POA: Insufficient documentation

## 2015-07-06 DIAGNOSIS — Z91040 Latex allergy status: Secondary | ICD-10-CM | POA: Insufficient documentation

## 2015-07-06 DIAGNOSIS — Z7982 Long term (current) use of aspirin: Secondary | ICD-10-CM | POA: Insufficient documentation

## 2015-07-06 DIAGNOSIS — I10 Essential (primary) hypertension: Secondary | ICD-10-CM | POA: Insufficient documentation

## 2015-07-06 DIAGNOSIS — N802 Endometriosis of fallopian tube: Secondary | ICD-10-CM | POA: Insufficient documentation

## 2015-07-06 DIAGNOSIS — Z9889 Other specified postprocedural states: Secondary | ICD-10-CM | POA: Insufficient documentation

## 2015-07-06 DIAGNOSIS — N800 Endometriosis of uterus: Secondary | ICD-10-CM | POA: Insufficient documentation

## 2015-07-06 DIAGNOSIS — M069 Rheumatoid arthritis, unspecified: Secondary | ICD-10-CM | POA: Insufficient documentation

## 2015-07-06 HISTORY — PX: LAPAROSCOPIC SALPINGO OOPHERECTOMY: SHX5927

## 2015-07-06 HISTORY — PX: LAPAROSCOPIC HYSTERECTOMY: SHX1926

## 2015-07-06 LAB — TYPE AND SCREEN
ABO/RH(D): O POS
Antibody Screen: NEGATIVE

## 2015-07-06 LAB — POCT PREGNANCY, URINE: Preg Test, Ur: NEGATIVE

## 2015-07-06 SURGERY — HYSTERECTOMY, TOTAL, LAPAROSCOPIC
Anesthesia: General

## 2015-07-06 SURGERY — HYSTERECTOMY, TOTAL, LAPAROSCOPIC
Anesthesia: Choice

## 2015-07-06 MED ORDER — SUCCINYLCHOLINE CHLORIDE 20 MG/ML IJ SOLN
INTRAMUSCULAR | Status: DC | PRN
  Administered 2015-07-06: 140 mg via INTRAVENOUS

## 2015-07-06 MED ORDER — FENTANYL CITRATE (PF) 100 MCG/2ML IJ SOLN
INTRAMUSCULAR | Status: DC | PRN
  Administered 2015-07-06 (×5): 50 ug via INTRAVENOUS

## 2015-07-06 MED ORDER — BUPIVACAINE HCL (PF) 0.5 % IJ SOLN
INTRAMUSCULAR | Status: AC
  Filled 2015-07-06: qty 30

## 2015-07-06 MED ORDER — NEOSTIGMINE METHYLSULFATE 10 MG/10ML IV SOLN
INTRAVENOUS | Status: DC | PRN
  Administered 2015-07-06: 4 mg via INTRAVENOUS

## 2015-07-06 MED ORDER — MENTHOL 3 MG MT LOZG: 1.0000 | LOZENGE | OROMUCOSAL | Status: DC | PRN

## 2015-07-06 MED ORDER — IBUPROFEN 600 MG PO TABS
600.0000 mg | ORAL_TABLET | Freq: Four times a day (QID) | ORAL | Status: DC
  Administered 2015-07-06 – 2015-07-07 (×3): 600 mg via ORAL
  Filled 2015-07-06 (×3): qty 1

## 2015-07-06 MED ORDER — ACETAMINOPHEN 10 MG/ML IV SOLN
INTRAVENOUS | Status: DC | PRN
  Administered 2015-07-06: 1000 mg via INTRAVENOUS

## 2015-07-06 MED ORDER — FENTANYL CITRATE (PF) 100 MCG/2ML IJ SOLN
INTRAMUSCULAR | Status: AC
  Filled 2015-07-06: qty 2

## 2015-07-06 MED ORDER — CEFAZOLIN SODIUM-DEXTROSE 2-3 GM-% IV SOLR
INTRAVENOUS | Status: AC
  Filled 2015-07-06: qty 50

## 2015-07-06 MED ORDER — ACETAMINOPHEN 325 MG PO TABS
650.0000 mg | ORAL_TABLET | ORAL | Status: DC
  Administered 2015-07-06 – 2015-07-07 (×4): 650 mg via ORAL
  Filled 2015-07-06 (×4): qty 2

## 2015-07-06 MED ORDER — LIDOCAINE HCL (CARDIAC) 20 MG/ML IV SOLN
INTRAVENOUS | Status: DC | PRN
  Administered 2015-07-06: 100 mg via INTRAVENOUS

## 2015-07-06 MED ORDER — HYDROMORPHONE HCL 1 MG/ML IJ SOLN
0.2000 mg | INTRAMUSCULAR | Status: DC | PRN
  Administered 2015-07-06: 0.2 mg via INTRAVENOUS
  Filled 2015-07-06: qty 1

## 2015-07-06 MED ORDER — LACTATED RINGERS IV SOLN
INTRAVENOUS | Status: DC
  Administered 2015-07-06 (×2): via INTRAVENOUS

## 2015-07-06 MED ORDER — ONDANSETRON HCL 4 MG/2ML IJ SOLN
INTRAMUSCULAR | Status: DC | PRN
  Administered 2015-07-06: 4 mg via INTRAVENOUS

## 2015-07-06 MED ORDER — PROPOFOL 10 MG/ML IV BOLUS
INTRAVENOUS | Status: DC | PRN
  Administered 2015-07-06: 200 mg via INTRAVENOUS

## 2015-07-06 MED ORDER — CEFAZOLIN SODIUM-DEXTROSE 2-3 GM-% IV SOLR
2.0000 g | INTRAVENOUS | Status: AC
  Administered 2015-07-06: 2 g via INTRAVENOUS

## 2015-07-06 MED ORDER — BUPIVACAINE HCL 0.5 % IJ SOLN
INTRAMUSCULAR | Status: DC | PRN
  Administered 2015-07-06: 7 mL

## 2015-07-06 MED ORDER — TRAMADOL HCL 50 MG PO TABS
50.0000 mg | ORAL_TABLET | Freq: Four times a day (QID) | ORAL | Status: DC | PRN
  Administered 2015-07-06 (×2): 50 mg via ORAL
  Filled 2015-07-06 (×2): qty 1

## 2015-07-06 MED ORDER — ONDANSETRON HCL 4 MG/2ML IJ SOLN: 4.0000 mg | Freq: Four times a day (QID) | INTRAMUSCULAR | Status: DC | PRN

## 2015-07-06 MED ORDER — MIDAZOLAM HCL 2 MG/2ML IJ SOLN
INTRAMUSCULAR | Status: DC | PRN
  Administered 2015-07-06: 2 mg via INTRAVENOUS

## 2015-07-06 MED ORDER — DEXAMETHASONE SODIUM PHOSPHATE 4 MG/ML IJ SOLN
INTRAMUSCULAR | Status: DC | PRN
  Administered 2015-07-06: 5 mg via INTRAVENOUS

## 2015-07-06 MED ORDER — FENTANYL CITRATE (PF) 100 MCG/2ML IJ SOLN
25.0000 ug | INTRAMUSCULAR | Status: DC | PRN
  Administered 2015-07-06 (×2): 50 ug via INTRAVENOUS

## 2015-07-06 MED ORDER — OXYCODONE HCL 5 MG PO TABS: 5.0000 mg | ORAL_TABLET | Freq: Once | ORAL | Status: DC | PRN

## 2015-07-06 MED ORDER — HEPARIN SODIUM (PORCINE) 5000 UNIT/ML IJ SOLN
5000.0000 [IU] | Freq: Once | INTRAMUSCULAR | Status: AC
  Administered 2015-07-06: 5000 [IU] via SUBCUTANEOUS

## 2015-07-06 MED ORDER — SIMETHICONE 80 MG PO CHEW: 80.0000 mg | CHEWABLE_TABLET | Freq: Four times a day (QID) | ORAL | Status: DC | PRN

## 2015-07-06 MED ORDER — ROCURONIUM BROMIDE 100 MG/10ML IV SOLN
INTRAVENOUS | Status: DC | PRN
  Administered 2015-07-06: 50 mg via INTRAVENOUS
  Administered 2015-07-06 (×2): 10 mg via INTRAVENOUS

## 2015-07-06 MED ORDER — HEPARIN SODIUM (PORCINE) 5000 UNIT/ML IJ SOLN
INTRAMUSCULAR | Status: AC
  Administered 2015-07-06: 5000 [IU] via SUBCUTANEOUS
  Filled 2015-07-06: qty 1

## 2015-07-06 MED ORDER — ONDANSETRON HCL 4 MG PO TABS: 4.0000 mg | ORAL_TABLET | Freq: Four times a day (QID) | ORAL | Status: DC | PRN

## 2015-07-06 MED ORDER — OXYCODONE HCL 5 MG/5ML PO SOLN
5.0000 mg | Freq: Once | ORAL | Status: DC | PRN
Start: 2015-07-06 — End: 2015-07-06

## 2015-07-06 MED ORDER — GLYCOPYRROLATE 0.2 MG/ML IJ SOLN
INTRAMUSCULAR | Status: DC | PRN
  Administered 2015-07-06: 0.6 mg via INTRAVENOUS

## 2015-07-06 MED ORDER — ACETAMINOPHEN 10 MG/ML IV SOLN
INTRAVENOUS | Status: AC
  Filled 2015-07-06: qty 100

## 2015-07-06 MED ORDER — KETOROLAC TROMETHAMINE 15 MG/ML IJ SOLN
15.0000 mg | Freq: Once | INTRAMUSCULAR | Status: AC
  Administered 2015-07-06: 15 mg via INTRAVENOUS
  Filled 2015-07-06: qty 1

## 2015-07-06 SURGICAL SUPPLY — 60 items
APPLICATOR ARISTA FLEXITIP XL (MISCELLANEOUS) ×4 IMPLANT
BAG URO DRAIN 2000ML W/SPOUT (MISCELLANEOUS) ×4 IMPLANT
BLADE SURG SZ11 CARB STEEL (BLADE) ×4 IMPLANT
CANISTER SUCT 1200ML W/VALVE (MISCELLANEOUS) ×4 IMPLANT
CATH FOLEY 2WAY  5CC 16FR (CATHETERS) ×2
CATH URTH 16FR FL 2W BLN LF (CATHETERS) ×2 IMPLANT
CHLORAPREP W/TINT 26ML (MISCELLANEOUS) ×4 IMPLANT
DEFOGGER SCOPE WARMER CLEARIFY (MISCELLANEOUS) ×4 IMPLANT
DRAPE LEGGINS SURG 28X43 STRL (DRAPES) ×4 IMPLANT
DRAPE UNDER BUTTOCK W/FLU (DRAPES) ×4 IMPLANT
DRESSING TELFA 4X3 1S ST N-ADH (GAUZE/BANDAGES/DRESSINGS) ×12 IMPLANT
DRSG TEGADERM 2-3/8X2-3/4 SM (GAUZE/BANDAGES/DRESSINGS) ×12 IMPLANT
ENDOPOUCH RETRIEVER 10 (MISCELLANEOUS) IMPLANT
GAUZE SPONGE NON-WVN 2X2 STRL (MISCELLANEOUS) ×4 IMPLANT
GLOVE BIOGEL PI IND STRL 6.5 (GLOVE) ×20 IMPLANT
GLOVE BIOGEL PI INDICATOR 6.5 (GLOVE) ×20
GLOVE SURG SYN 6.5 ES PF (GLOVE) ×16 IMPLANT
GOWN STRL REUS W/ TWL LRG LVL3 (GOWN DISPOSABLE) ×6 IMPLANT
GOWN STRL REUS W/ TWL XL LVL3 (GOWN DISPOSABLE) ×2 IMPLANT
GOWN STRL REUS W/TWL LRG LVL3 (GOWN DISPOSABLE) ×6
GOWN STRL REUS W/TWL XL LVL3 (GOWN DISPOSABLE) ×2
GRASPER SUT TROCAR 14GX15 (MISCELLANEOUS) ×4 IMPLANT
HEMOSTAT ARISTA ABSORB 3G PWDR (MISCELLANEOUS) ×4 IMPLANT
HIBICLENS CHG 4% 32OZ (MISCELLANEOUS) ×4 IMPLANT
IRRIGATION STRYKERFLOW (MISCELLANEOUS) ×2 IMPLANT
IRRIGATOR STRYKERFLOW (MISCELLANEOUS) ×4
IV LACTATED RINGERS 1000ML (IV SOLUTION) ×4 IMPLANT
JELLY LUB 2OZ STRL (MISCELLANEOUS) ×2
JELLY LUBE 2OZ STRL (MISCELLANEOUS) ×2 IMPLANT
KIT RM TURNOVER CYSTO AR (KITS) ×4 IMPLANT
LABEL OR SOLS (LABEL) ×4 IMPLANT
LIGASURE 5MM LAPAROSCOPIC (INSTRUMENTS) IMPLANT
LIGASURE BLUNT 5MM 37CM (INSTRUMENTS) ×4 IMPLANT
LIQUID BAND (GAUZE/BANDAGES/DRESSINGS) ×4 IMPLANT
MANIPULATOR UTERINE ZUMI 4.5 (MISCELLANEOUS) IMPLANT
MANIPULATOR VCARE LG CRV RETR (MISCELLANEOUS) ×4 IMPLANT
MANIPULATOR VCARE STD CRV RETR (MISCELLANEOUS) IMPLANT
NDL SAFETY 22GX1.5 (NEEDLE) ×4 IMPLANT
NEEDLE VERESS 14GA 120MM (NEEDLE) ×4 IMPLANT
NS IRRIG 500ML POUR BTL (IV SOLUTION) ×4 IMPLANT
PACK LAP CHOLECYSTECTOMY (MISCELLANEOUS) ×4 IMPLANT
PAD OB MATERNITY 4.3X12.25 (PERSONAL CARE ITEMS) ×4 IMPLANT
PAD PREP 24X41 OB/GYN DISP (PERSONAL CARE ITEMS) ×4 IMPLANT
PAD TRENDELENBURG OR TABLE (MISCELLANEOUS) ×4 IMPLANT
SCISSORS METZENBAUM CVD 33 (INSTRUMENTS) ×4 IMPLANT
SET CYSTO W/LG BORE CLAMP LF (SET/KITS/TRAYS/PACK) ×4 IMPLANT
SHEARS HARMONIC ACE PLUS 36CM (ENDOMECHANICALS) IMPLANT
SLEEVE ENDOPATH XCEL 5M (ENDOMECHANICALS) ×8 IMPLANT
SPONGE LAP 18X18 5 PK (GAUZE/BANDAGES/DRESSINGS) ×4 IMPLANT
SPONGE VERSALON 2X2 STRL (MISCELLANEOUS) ×4
SUT MNCRL AB 4-0 PS2 18 (SUTURE) ×4 IMPLANT
SUT VIC AB 0 CT1 36 (SUTURE) ×4 IMPLANT
SUT VIC AB 2-0 UR6 27 (SUTURE) IMPLANT
SUT VIC AB 4-0 PS2 18 (SUTURE) IMPLANT
SYR 50ML LL SCALE MARK (SYRINGE) ×4 IMPLANT
SYRINGE 10CC LL (SYRINGE) ×8 IMPLANT
TROCAR ENDO BLADELESS 11MM (ENDOMECHANICALS) ×4 IMPLANT
TROCAR XCEL NON-BLD 5MMX100MML (ENDOMECHANICALS) ×4 IMPLANT
TUBING INSUFFLATOR HEATED (MISCELLANEOUS) ×4 IMPLANT
TUBING INSUFFLATOR HI FLOW (MISCELLANEOUS) ×4 IMPLANT

## 2015-07-06 NOTE — Anesthesia Postprocedure Evaluation (Signed)
  Anesthesia Post-op Note  Patient: Melissa Sparks  Procedure(s) Performed: Procedure(s): HYSTERECTOMY TOTAL LAPAROSCOPIC (N/A) LAPAROSCOPIC SALPINGO OOPHORECTOMY (Bilateral)  Anesthesia type:General ETT  Patient location: PACU  Post pain: Pain level controlled  Post assessment: Post-op Vital signs reviewed, Patient's Cardiovascular Status Stable, Respiratory Function Stable, Patent Airway and No signs of Nausea or vomiting  Post vital signs: Reviewed and stable  Last Vitals:  Filed Vitals:   07/06/15 1502  BP: 114/72  Pulse: 74  Temp: 36.4 C  Resp: 20    Level of consciousness: awake, alert  and patient cooperative  Complications: No apparent anesthesia complications

## 2015-07-06 NOTE — Anesthesia Procedure Notes (Signed)
Procedure Name: Intubation Date/Time: 07/06/2015 11:11 AM Performed by: Aline Brochure Pre-anesthesia Checklist: Patient identified, Emergency Drugs available, Suction available and Patient being monitored Patient Re-evaluated:Patient Re-evaluated prior to inductionOxygen Delivery Method: Circle system utilized Preoxygenation: Pre-oxygenation with 100% oxygen Intubation Type: IV induction Ventilation: Mask ventilation without difficulty Laryngoscope Size: Mac and 3 Grade View: Grade III Tube type: Oral Tube size: 7.0 mm Number of attempts: 1 Airway Equipment and Method: Bougie stylet Placement Confirmation: positive ETCO2 and breath sounds checked- equal and bilateral Secured at: 22 cm Tube secured with: Tape Dental Injury: Teeth and Oropharynx as per pre-operative assessment

## 2015-07-06 NOTE — Progress Notes (Signed)
Upon reassessing pain level. Pt states she is at a 8 or 9. Spoke with dr.harris who is on call for Dr.ward and verbalizes to give pt 0.2 of dilaudid now and try tramadol later.

## 2015-07-06 NOTE — Transfer of Care (Signed)
Immediate Anesthesia Transfer of Care Note  Patient: Melissa Sparks  Procedure(s) Performed: Procedure(s): HYSTERECTOMY TOTAL LAPAROSCOPIC (N/A) LAPAROSCOPIC SALPINGO OOPHORECTOMY (Bilateral)  Patient Location: PACU  Anesthesia Type:General  Level of Consciousness: awake  Airway & Oxygen Therapy: Patient Spontanous Breathing and Patient connected to face mask oxygen  Post-op Assessment: Report given to RN and Post -op Vital signs reviewed and stable  Post vital signs: Reviewed and stable  Last Vitals:  Filed Vitals:   07/06/15 1349  BP: 106/75  Pulse: 65  Temp: 36.3 C  Resp: 17    Complications: No apparent anesthesia complications

## 2015-07-06 NOTE — Op Note (Signed)
Total Laparoscopic Hysterectomy Operative Note Procedure Date: 07/06/2015  Patient:  Melissa Sparks  49 y.o. female  PRE-OPERATIVE DIAGNOSIS:  Symptomatic Fibroid Uterus, Abnormal Uterine Bleeding POST-OPERATIVE DIAGNOSIS:  Same  PROCEDURE:  Procedure(s): HYSTERECTOMY TOTAL LAPAROSCOPIC (N/A) >250g LAPAROSCOPIC SALPINGO OOPHORECTOMY (Bilateral)  SURGEON:  Surgeon(s) and Role:    * Marqui Formby C Prabhav Faulkenberry, MD - Primary    * Gae Dry, MD - Assisting  ANESTHESIA:  General via ET  I/O  Total I/O In: 1000 [I.V.:1000] Out: 450 [Urine:300; Blood:150]  FINDINGS:  Large irregularly shaped fibroid uterus, normal ovaries with small left simple cyst, and normal fallopian tubes bilaterally.  Normal upper abdomen.  SPECIMEN: Uterus, Cervix, and bilateral fallopian tubes  COMPLICATIONS: none apparent  DISPOSITION: vital signs stable to PACU  Indication for Surgery: 49 y.o. who presented withsymptomatic abnormal uterine bleeding and request for hysterectomy.  Ultrasound showed multiple fibroids, too numerous to count, and patient refused EMB.  D&C was performed prior to surgery to exclude malignancy.  Risks of surgery were discussed with the patient including but not limited to: bleeding which may require transfusion or reoperation; infection which may require antibiotics; injury to bowel, bladder, ureters or other surrounding organs; need for additional procedures including laparotomy, blood clot, incisional problems and other postoperative/anesthesia complications. Written informed consent was obtained.      PROCEDURE IN DETAIL:  The patient had 5000u Heparin Sub-q and sequential compression devices applied to her lower extremities while in the preoperative area.  She was then taken to the operating room where general anesthesia was administered and was found to be adequate.  She was placed in the dorsal lithotomy position, and was prepped and draped in a sterile manner. A surgical time-out was  performed.  A Foley catheter was inserted into her bladder and attached to constant drainage and a V-Care uterine manipulator was then advanced into the uterus and a good fit around the cervix was noted. The gloves were changed, and attention was turned to the abdomen where a supraumbilical incision was made with the scalpel. A Veress needle was inserted and a drop test confirmed appropriate position.  Opening pressure was 43mmHg, and the abdomen was insufflated to 15mg Hg carbon dioxide gas and adequate pneumoperitoneum was obtained. A 97mm trochar was inserted in the incision using a visiport method. A survey of the patient's pelvis and abdomen revealed the findings as mentioned above. Two 66mm ports were inserted in the lower left and right quadrants under visualization.    The bilateral fallopian tubes were separated from the mesosalpinx using the Ligasure. The bilateral round ligaments were transected and anterior broad ligament divided and brought across the uterus to separate the vesicouterine peritoneum and create a bladder flap. The bladder was pushed away from the uterus. The bilateral uterine arteries were ligated and transected. The bilateral uterosacral and cardinal ligaments were ligated and transected. A colpotomy was made around the V-Care cervical cup and the uterus, cervix, and bilateral tubes were removed through the vagina. The vaginal cuff was closed vaginally using 0-Vicryl in a running locking stitch. This was tested for integrity using the surgeon's finger. After a change of gloves, the pneumoperitoneum was recreated and surgical site inspected, cauterized where necessary, and due to an area of persistent ooze, Arista coagulant powder was spread across the surgical bed, which was then found to be hemostatic. Bilateral ureters were visualized vermiuclating. No intraoperative injury to surrounding organs was noted. The abdomen was desufflated and all instruments were then removed.  All skin incisions were closed with 4-0 monocryl and covered with surgical glue. The patient tolerated the procedure well.  All instruments, needles, and sponge counts were correct x 2. The patient was taken to the recovery room in stable condition.   ---- Larey Days, MD Attending Obstetrician and Gynecologist [facility]

## 2015-07-06 NOTE — Progress Notes (Signed)
Received patient post op, VSS, patient has a latex allergy, IV fluids infusing as ordered, site without S/S of infiltration or infection, patient is drowsy but easily aroused for care. Medicated for pain and patient stated that pain is much better.

## 2015-07-06 NOTE — Anesthesia Preprocedure Evaluation (Signed)
Anesthesia Evaluation  Patient identified by MRN, date of birth, ID band Patient awake    Reviewed: Allergy & Precautions, H&P , NPO status , Patient's Chart, lab work & pertinent test results, reviewed documented beta blocker date and time   Airway Mallampati: III  TM Distance: >3 FB Neck ROM: full    Dental no notable dental hx. (+) Teeth Intact   Pulmonary former smoker,  breath sounds clear to auscultation  Pulmonary exam normal       Cardiovascular Exercise Tolerance: Good hypertension, Normal cardiovascular examRhythm:regular Rate:Normal     Neuro/Psych negative neurological ROS  negative psych ROS   GI/Hepatic negative GI ROS, Neg liver ROS,   Endo/Other  negative endocrine ROS  Renal/GU negative Renal ROS  negative genitourinary   Musculoskeletal   Abdominal   Peds  Hematology negative hematology ROS (+)   Anesthesia Other Findings Past Medical History:   Hypertension                                                 Anemia                                                       Signs and symptoms suggestive of sleep apnea    Reproductive/Obstetrics negative OB ROS                             Anesthesia Physical Anesthesia Plan  ASA: III  Anesthesia Plan: General ETT   Post-op Pain Management:    Induction:   Airway Management Planned:   Additional Equipment:   Intra-op Plan:   Post-operative Plan:   Informed Consent: I have reviewed the patients History and Physical, chart, labs and discussed the procedure including the risks, benefits and alternatives for the proposed anesthesia with the patient or authorized representative who has indicated his/her understanding and acceptance.   Dental Advisory Given  Plan Discussed with: Anesthesiologist, CRNA and Surgeon  Anesthesia Plan Comments:         Anesthesia Quick Evaluation

## 2015-07-06 NOTE — Progress Notes (Signed)
H&P Update  Pt was last seen in my office, and complete history and physical performed.  The surgical history has been reviewed and remains accurate without interval change. The patient was re-examined and patient's physiologic condition has not changed significantly in the last 30 days.  No new pharmacological allergies or types of therapy has been initiated.  Allergies  Allergen Reactions  . Latex Rash    SENSITIVITY   @CURRENTMEDS @ Past Medical History  Diagnosis Date  . Hypertension   . Anemia    Past Surgical History  Procedure Laterality Date  . Tubal ligation  1987  . Hysteroscopy w/d&c  06/11/2015    Procedure: DILATATION AND CURETTAGE /HYSTEROSCOPY;  Surgeon: Honor Loh Ward, MD;  Location: [facility] ORS;  Service: Gynecology;;    BP 115/53 mmHg  Pulse 65  Temp(Src) 98.3 F (36.8 C) (Oral)  Resp 16  Ht 5\' 7"  (1.702 m)  Wt 108.863 kg (240 lb)  BMI 37.58 kg/m2  SpO2 100%  LMP 06/28/2015  NAD RRR no murmurs CTAB, no wheezing, resps unlabored +BS, soft, NTTP No c/c/e Pelvic exam deferred  The above history was confirmed with the patient. The condition still exists that makes this procedure necessary. Surgical plan includes total laparoscopic hysterectomy, bilateral salpingectomy, as confirmed on the consent. The treatment plan remains the same, without new options for care.  The patient understands the potential benefits and risks and the consents have been signed and placed on the chart.     Larey Days, MD Attending Obstetrician Gynecologist [facility]

## 2015-07-06 NOTE — Discharge Summary (Signed)
Gynecology Physician Postoperative Discharge Summary  Patient ID: Melissa Sparks MRN: [id number] DOB/AGE: 1966/06/25 49 y.o.  Admit Date: 07/06/2015 Discharge Date: 07/07/2015  Preoperative Diagnoses: Symptomatic fibroid uterus, abnormal uterine bleeding  Procedures: Procedure(s) (LRB): HYSTERECTOMY TOTAL LAPAROSCOPIC (N/A) LAPAROSCOPIC SALPINGO OOPHORECTOMY (Bilateral)  CBC Latest Ref Rng 06/10/2015 03/18/2015  WBC 3.6 - 11.0 K/uL 5.2 5.7  Hemoglobin 12.0 - 16.0 g/dL 12.3 12.4  Hematocrit 35.0 - 47.0 % 36.7 38.2  Platelets 150 - 440 K/uL 229 244    Hospital Course:  Melissa Sparks is a 49 y.o. female admitted for scheduled surgery.  She underwent the procedures as mentioned above, her operation was uncomplicated. For further details about surgery, please refer to the operative report. Patient had an uncomplicated postoperative course. By time of discharge on POD#1, her pain was controlled on oral pain medications; she was ambulating, voiding without difficulty, tolerating regular diet and passing flatus. She was deemed stable for discharge to home.   Discharge Exam:  Blood pressure 106/75, pulse 65, temperature 97.4 F (36.3 C), temperature source Temporal, resp. rate 17, SpO2 100 %. General appearance: alert and no distress  Resp: clear to auscultation bilaterally, normal respiratory effort Cardio: regular rate and rhythm  GI: soft, non-tender; bowel sounds normal; no masses, no organomegaly.  Incision: C/D/I, no erythema, no drainage noted Pelvic: scant blood on pad  Extremities: extremities normal, atraumatic, no cyanosis or edema and Homans sign is negative, no sign of DVT  Discharged Condition: Stable  Disposition: 01-Home or Self Care      Discharge Instructions    Call MD for:  difficulty breathing, headache or visual disturbances    Complete by:  As directed      Call MD for:  extreme fatigue    Complete by:  As directed      Call MD for:  persistant dizziness or  light-headedness    Complete by:  As directed      Call MD for:  persistant nausea and vomiting    Complete by:  As directed      Call MD for:  redness, tenderness, or signs of infection (pain, swelling, redness, odor or green/yellow discharge around incision site)    Complete by:  As directed      Call MD for:  severe uncontrolled pain    Complete by:  As directed      Call MD for:  temperature >100.4    Complete by:  As directed      Diet - low sodium heart healthy    Complete by:  As directed      Increase activity slowly    Complete by:  As directed      No dressing needed    Complete by:  As directed             Medication List    TAKE these medications        acetaminophen 325 MG tablet  Commonly known as:  TYLENOL  Take 2 tablets (650 mg total) by mouth every 4 (four) hours.     aspirin 81 MG tablet  Take 81 mg by mouth daily.     ibuprofen 600 MG tablet  Commonly known as:  ADVIL,MOTRIN  Take 1 tablet (600 mg total) by mouth every 6 (six) hours as needed.     losartan-hydrochlorothiazide 100-25 MG per tablet  Commonly known as:  HYZAAR  Take 1 tablet by mouth daily.     meloxicam 15 MG tablet  Commonly  known as:  MOBIC  Take 15 mg by mouth daily.     metoprolol 50 MG tablet  Commonly known as:  LOPRESSOR  Take 50 mg by mouth 2 (two) times daily.     traMADol 50 MG tablet  Commonly known as:  ULTRAM  Take 1 tablet (50 mg total) by mouth every 6 (six) hours as needed for moderate pain.         Signed: ----- Larey Days, MD Attending Obstetrician and Gynecologist [facility]

## 2015-07-07 DIAGNOSIS — D259 Leiomyoma of uterus, unspecified: Secondary | ICD-10-CM | POA: Diagnosis not present

## 2015-07-07 LAB — SURGICAL PATHOLOGY

## 2015-07-07 MED ORDER — IBUPROFEN 600 MG PO TABS: 600.0000 mg | ORAL_TABLET | Freq: Four times a day (QID) | ORAL | Status: DC | PRN

## 2015-07-07 MED ORDER — TRAMADOL HCL 50 MG PO TABS: 50.0000 mg | ORAL_TABLET | Freq: Four times a day (QID) | ORAL | Status: DC | PRN

## 2015-07-07 MED ORDER — ACETAMINOPHEN 325 MG PO TABS: 650.0000 mg | ORAL_TABLET | ORAL | Status: DC

## 2015-07-07 NOTE — Discharge Instructions (Signed)
Discharge instructions:   Call office if you have any of the following: headache, visual changes, fever >100 F, chills, breast concerns, excessive vaginal bleeding, incision drainage or problems, leg pain or redness, depression or any other concerns.   Activity: Do not lift > 10 lbs for 6 weeks.  No intercourse or tampons for 8 weeks.  No driving for 1-2 weeks.   Call your doctor for increased pain or vaginal bleeding, temperature above 100.4, depression, or concerns.  No strenuous activity or heavy lifting for 6 weeks.  No intercourse, tampons, douching, or enemas for 8 weeks.  No tub baths-showers only.  No driving for 2 weeks or while taking pain medications.

## 2015-10-03 ENCOUNTER — Encounter: Payer: Self-pay | Admitting: Emergency Medicine

## 2015-10-03 ENCOUNTER — Emergency Department
Admission: EM | Admit: 2015-10-03 | Discharge: 2015-10-03 | Disposition: A | Discharge status: Home / Self Care | Payer: BLUE CROSS/BLUE SHIELD | Attending: Emergency Medicine | Admitting: Emergency Medicine

## 2015-10-03 DIAGNOSIS — R300 Dysuria: Secondary | ICD-10-CM | POA: Insufficient documentation

## 2015-10-03 DIAGNOSIS — I10 Essential (primary) hypertension: Secondary | ICD-10-CM | POA: Insufficient documentation

## 2015-10-03 DIAGNOSIS — R1030 Lower abdominal pain, unspecified: Secondary | ICD-10-CM | POA: Insufficient documentation

## 2015-10-03 DIAGNOSIS — Z87891 Personal history of nicotine dependence: Secondary | ICD-10-CM | POA: Diagnosis not present

## 2015-10-03 DIAGNOSIS — Z7982 Long term (current) use of aspirin: Secondary | ICD-10-CM | POA: Diagnosis not present

## 2015-10-03 DIAGNOSIS — H9201 Otalgia, right ear: Secondary | ICD-10-CM | POA: Insufficient documentation

## 2015-10-03 DIAGNOSIS — Z91040 Latex allergy status: Secondary | ICD-10-CM | POA: Diagnosis not present

## 2015-10-03 DIAGNOSIS — Z79899 Other long term (current) drug therapy: Secondary | ICD-10-CM | POA: Insufficient documentation

## 2015-10-03 LAB — COMPREHENSIVE METABOLIC PANEL
ALBUMIN: 3.7 g/dL (ref 3.5–5.0)
ALK PHOS: 90 U/L (ref 38–126)
ALT: 21 U/L (ref 14–54)
ANION GAP: 6 (ref 5–15)
AST: 21 U/L (ref 15–41)
BUN: 13 mg/dL (ref 6–20)
CHLORIDE: 102 mmol/L (ref 101–111)
CO2: 28 mmol/L (ref 22–32)
Calcium: 9 mg/dL (ref 8.9–10.3)
Creatinine, Ser: 0.76 mg/dL (ref 0.44–1.00)
GFR calc Af Amer: >60 mL/min (ref >60)
GFR calc non Af Amer: >60 mL/min (ref >60)
GLUCOSE: 105 mg/dL — AB (ref 65–99)
POTASSIUM: 3.3 mmol/L — AB (ref 3.5–5.1)
Sodium: 136 mmol/L (ref 135–145)
Total Bilirubin: 0.5 mg/dL (ref 0.3–1.2)
Total Protein: 7.6 g/dL (ref 6.5–8.1)

## 2015-10-03 LAB — URINALYSIS COMPLETE WITH MICROSCOPIC (ARMC ONLY)
Bilirubin Urine: NEGATIVE
Glucose, UA: NEGATIVE mg/dL
Ketones, ur: NEGATIVE mg/dL
LEUKOCYTES UA: NEGATIVE
Nitrite: NEGATIVE
PH: 7 (ref 5.0–8.0)
Protein, ur: NEGATIVE mg/dL
SPECIFIC GRAVITY, URINE: 1.01 (ref 1.005–1.030)

## 2015-10-03 LAB — CBC
HCT: 36.9 % (ref 35.0–47.0)
Hemoglobin: 12.3 g/dL (ref 12.0–16.0)
MCH: 28.5 pg (ref 26.0–34.0)
MCHC: 33.3 g/dL (ref 32.0–36.0)
MCV: 85.6 fL (ref 80.0–100.0)
Platelets: 235 10*3/uL (ref 150–440)
RBC: 4.31 MIL/uL (ref 3.80–5.20)
RDW: 14.2 % (ref 11.5–14.5)
WBC: 5.4 10*3/uL (ref 3.6–11.0)

## 2015-10-03 LAB — LIPASE, BLOOD: Lipase: 21 U/L (ref 11–51)

## 2015-10-03 MED ORDER — AMOXICILLIN 500 MG PO CAPS: 500.0000 mg | ORAL_CAPSULE | Freq: Two times a day (BID) | ORAL | Status: DC

## 2015-10-03 NOTE — ED Notes (Signed)
Pt reports lower abd pressure for past couple of days. Denies fever. Denies urinary complaints or vaginal discharge. States had a hysterectomy 2 months ago. Pt also complains of pain in  Right ear for past couple of days. States she feels like the room is spinning.

## 2015-10-03 NOTE — ED Provider Notes (Signed)
[facility] Emergency Department Provider Note  ____________________________________________  Time seen: 2 PM  I have reviewed the triage vital signs and the nursing notes.   HISTORY  Chief Complaint Abdominal Pain and Otalgia    HPI Melissa Sparks is a 49 y.o. female who presents with primary complaint of right otalgia. She also notes that she has had some pressure with urinating over the last couple of days and she is worried she may have a urinary tract infection. She denies fevers chills. No nausea no vomiting. She also states that her son died 3 days ago and she believes a lot of these symptoms may be related to that. She denies si but does report depression but does not request to speak with anyone.     Past Medical History  Diagnosis Date  . Hypertension   . Anemia     Patient Active Problem List   Diagnosis Date Noted  . S/P hysterectomy 07/06/2015    Past Surgical History  Procedure Laterality Date  . Tubal ligation  1987  . Hysteroscopy w/d&c  06/11/2015    Procedure: DILATATION AND CURETTAGE /HYSTEROSCOPY;  Surgeon: Honor Loh Ward, MD;  Location: [facility] ORS;  Service: Gynecology;;  . Laparoscopic hysterectomy N/A 07/06/2015    Procedure: HYSTERECTOMY TOTAL LAPAROSCOPIC;  Surgeon: Honor Loh Ward, MD;  Location: [facility] ORS;  Service: Gynecology;  Laterality: N/A;  . Laparoscopic salpingo oopherectomy Bilateral 07/06/2015    Procedure: LAPAROSCOPIC SALPINGO OOPHORECTOMY;  Surgeon: Honor Loh Ward, MD;  Location: [facility] ORS;  Service: Gynecology;  Laterality: Bilateral;    Current Outpatient Rx  Name  Route  Sig  Dispense  Refill  . acetaminophen (TYLENOL) 325 MG tablet   Oral   Take 2 tablets (650 mg total) by mouth every 4 (four) hours.   60 tablet      . aspirin 81 MG tablet   Oral   Take 81 mg by mouth daily.         Marland Kitchen ibuprofen (ADVIL,MOTRIN) 600 MG tablet   Oral   Take 1 tablet (600 mg total) by mouth every 6 (six) hours as  needed.   45 tablet   0   . losartan-hydrochlorothiazide (HYZAAR) 100-25 MG per tablet   Oral   Take 1 tablet by mouth daily.         . meloxicam (MOBIC) 15 MG tablet   Oral   Take 15 mg by mouth daily.         . metoprolol (LOPRESSOR) 50 MG tablet   Oral   Take 50 mg by mouth 2 (two) times daily.         . traMADol (ULTRAM) 50 MG tablet   Oral   Take 1 tablet (50 mg total) by mouth every 6 (six) hours as needed for moderate pain.   45 tablet   0     Allergies Latex  No family history on file.  Social History Social History  Substance Use Topics  . Smoking status: Former Smoker -- 2.00 packs/day for 30 years    Types: Cigarettes    Quit date: 03/09/2015  . Smokeless tobacco: Never Used  . Alcohol Use: No    Review of Systems  Constitutional: Negative for fever. Eyes: Negative for visual changes. ENT: Negative for sore throat. Right ear aching Cardiovascular: Negative for chest pain. Respiratory: Negative for shortness of breath. Gastrointestinal: Negative for abdominal pain, vomiting and diarrhea. Genitourinary: Pressure sensation when urinating Musculoskeletal: Negative for back pain. Skin: Negative  for rash. Neurological: Negative for headaches or focal weakness Psychiatric: Grieving over the loss of her son    ____________________________________________   PHYSICAL EXAM:  VITAL SIGNS: ED Triage Vitals  Enc Vitals Group     BP 10/03/15 1221 135/54 mmHg     Pulse Rate 10/03/15 1221 89     Resp 10/03/15 1221 20     Temp 10/03/15 1221 98.4 F (36.9 C)     Temp Source 10/03/15 1221 Oral     SpO2 10/03/15 1221 97 %     Weight 10/03/15 1221 250 lb (113.399 kg)     Height 10/03/15 1221 5\' 7"  (1.702 m)     Head Cir --      Peak Flow --      Pain Score 10/03/15 1230 8     Pain Loc --      Pain Edu? --      Excl. in [place]? --      Constitutional: Alert and oriented. Well appearing and in no distress. Eyes: Conjunctivae are normal.  ENT    Head: Normocephalic and atraumatic.   Mouth/Throat: Mucous membranes are moist. Ears: Dullness to right tympanic membrane, no erythema Cardiovascular: Normal rate, regular rhythm. Normal and symmetric distal pulses are present in all extremities. No murmurs, rubs, or gallops. Respiratory: Normal respiratory effort without tachypnea nor retractions. Breath sounds are clear and equal bilaterally.  Gastrointestinal: Soft and non-tender in all quadrants. No distention. There is no CVA tenderness. Genitourinary: deferred Musculoskeletal: Nontender with normal range of motion in all extremities. No lower extremity tenderness nor edema. Neurologic:  Normal speech and language. No gross focal neurologic deficits are appreciated. Skin:  Skin is warm, dry and intact. No rash noted. Psychiatric: Mood and affect are normal. Patient exhibits appropriate insight and judgment.  ____________________________________________    LABS (pertinent positives/negatives)  Labs Reviewed  COMPREHENSIVE METABOLIC PANEL - Abnormal; Notable for the following:    Potassium 3.3 (*)    Glucose, Bld 105 (*)    All other components within normal limits  LIPASE, BLOOD  CBC  URINALYSIS COMPLETEWITH MICROSCOPIC ([facility] ONLY)    ____________________________________________   EKG  None  ____________________________________________    RADIOLOGY I have personally reviewed any xrays that were ordered on this patient: None  ____________________________________________   PROCEDURES  Procedure(s) performed: none  Critical Care performed: none  ____________________________________________   INITIAL IMPRESSION / ASSESSMENT AND PLAN / ED COURSE  Pertinent labs & imaging results that were available during my care of the patient were reviewed by me and considered in my medical decision making (see chart for details).  Patient overall well-appearing. Sitting in the edge of the bed. She denies abdominal  pain at this time. She reports when she urinated she not have any discomfort in the emergency department. Primary complaint is right sided otalgia no evidence of viral infection at this time. We will check labs and urine and reevaluate    Labs and urine are normal. We will treat otalgia ____________________________________________   FINAL CLINICAL IMPRESSION(S) / ED DIAGNOSES  Final diagnoses:  Otalgia, right  Lower abdominal pain     Lavonia Drafts, MD 10/03/15 1718

## 2015-10-03 NOTE — Discharge Instructions (Signed)
Earache An earache, also called otalgia, can be caused by many things. Pain from an earache can be sharp, dull, or burning. The pain may be temporary or constant. Earaches can be caused by problems with the ear, such as infection in either the middle ear or the ear canal, injury, impacted ear wax, middle ear pressure, or a foreign body in the ear. Ear pain can also result from problems in other areas. This is called referred pain. For example, pain can come from a sore throat, a tooth infection, or problems with the jaw or the joint between the jaw and the skull (temporomandibular joint, or TMJ). The cause of an earache is not always easy to identify. Watchful waiting may be appropriate for some earaches until a clear cause of the pain can be found. HOME CARE INSTRUCTIONS Watch your condition for any changes. The following actions may help to lessen any discomfort that you are feeling:  Take medicines only as directed by your health care provider. This includes ear drops.  Apply ice to your outer ear to help reduce pain.  Put ice in a plastic bag.  Place a towel between your skin and the bag.  Leave the ice on for 20 minutes, 2-3 times per day.  Do not put anything in your ear other than medicine that is prescribed by your health care provider.  Try resting in an upright position instead of lying down. This may help to reduce pressure in the middle ear and relieve pain.  Chew gum if it helps to relieve your ear pain.  Control any allergies that you have.  Keep all follow-up visits as directed by your health care provider. This is important. SEEK MEDICAL CARE IF:  Your pain does not improve within 2 days.  You have a fever.  You have new or worsening symptoms. SEEK IMMEDIATE MEDICAL CARE IF:  You have a severe headache.  You have a stiff neck.  You have difficulty swallowing.  You have redness or swelling behind your ear.  You have drainage from your ear.  You have hearing  loss.  You feel dizzy.   This information is not intended to replace advice given to you by your health care provider. Make sure you discuss any questions you have with your health care provider.   Document Released: 07/07/2004 Document Revised: 12/11/2014 Document Reviewed: 06/21/2014 Elsevier Interactive Patient Education 2016 [employer]  

## 2016-01-04 ENCOUNTER — Encounter: Payer: Self-pay | Admitting: Emergency Medicine

## 2016-01-04 ENCOUNTER — Emergency Department
Admission: EM | Admit: 2016-01-04 | Discharge: 2016-01-04 | Disposition: A | Discharge status: Home / Self Care | Payer: BLUE CROSS/BLUE SHIELD | Attending: Emergency Medicine | Admitting: Emergency Medicine

## 2016-01-04 DIAGNOSIS — Z87891 Personal history of nicotine dependence: Secondary | ICD-10-CM | POA: Insufficient documentation

## 2016-01-04 DIAGNOSIS — Z7982 Long term (current) use of aspirin: Secondary | ICD-10-CM | POA: Diagnosis not present

## 2016-01-04 DIAGNOSIS — R519 Headache, unspecified: Secondary | ICD-10-CM

## 2016-01-04 DIAGNOSIS — Z792 Long term (current) use of antibiotics: Secondary | ICD-10-CM | POA: Insufficient documentation

## 2016-01-04 DIAGNOSIS — Z791 Long term (current) use of non-steroidal anti-inflammatories (NSAID): Secondary | ICD-10-CM | POA: Insufficient documentation

## 2016-01-04 DIAGNOSIS — R42 Dizziness and giddiness: Secondary | ICD-10-CM | POA: Insufficient documentation

## 2016-01-04 DIAGNOSIS — R51 Headache: Secondary | ICD-10-CM | POA: Insufficient documentation

## 2016-01-04 DIAGNOSIS — Z91040 Latex allergy status: Secondary | ICD-10-CM | POA: Insufficient documentation

## 2016-01-04 DIAGNOSIS — Z79899 Other long term (current) drug therapy: Secondary | ICD-10-CM | POA: Insufficient documentation

## 2016-01-04 DIAGNOSIS — I10 Essential (primary) hypertension: Secondary | ICD-10-CM | POA: Diagnosis not present

## 2016-01-04 DIAGNOSIS — R202 Paresthesia of skin: Secondary | ICD-10-CM | POA: Insufficient documentation

## 2016-01-04 LAB — CBC
HEMATOCRIT: 38.1 % (ref 35.0–47.0)
HEMOGLOBIN: 13 g/dL (ref 12.0–16.0)
MCH: 28.9 pg (ref 26.0–34.0)
MCHC: 34 g/dL (ref 32.0–36.0)
MCV: 84.9 fL (ref 80.0–100.0)
Platelets: 240 10*3/uL (ref 150–440)
RBC: 4.48 MIL/uL (ref 3.80–5.20)
RDW: 14.3 % (ref 11.5–14.5)
WBC: 4.6 10*3/uL (ref 3.6–11.0)

## 2016-01-04 LAB — BASIC METABOLIC PANEL
ANION GAP: 7 (ref 5–15)
BUN: 14 mg/dL (ref 6–20)
CALCIUM: 9.2 mg/dL (ref 8.9–10.3)
CO2: 27 mmol/L (ref 22–32)
Chloride: 104 mmol/L (ref 101–111)
Creatinine, Ser: 0.68 mg/dL (ref 0.44–1.00)
GFR calc non Af Amer: >60 mL/min (ref >60)
Glucose, Bld: 104 mg/dL — ABNORMAL HIGH (ref 65–99)
POTASSIUM: 3.6 mmol/L (ref 3.5–5.1)
Sodium: 138 mmol/L (ref 135–145)

## 2016-01-04 LAB — URINALYSIS COMPLETE WITH MICROSCOPIC (ARMC ONLY)
BILIRUBIN URINE: NEGATIVE
GLUCOSE, UA: NEGATIVE mg/dL
Ketones, ur: NEGATIVE mg/dL
LEUKOCYTES UA: NEGATIVE
NITRITE: NEGATIVE
Protein, ur: NEGATIVE mg/dL
Specific Gravity, Urine: 1.021 (ref 1.005–1.030)
WBC, UA: NONE SEEN WBC/hpf (ref 0–5)
pH: 5 (ref 5.0–8.0)

## 2016-01-04 MED ORDER — MECLIZINE HCL 25 MG PO TABS: 25.0000 mg | ORAL_TABLET | Freq: Three times a day (TID) | ORAL | Status: DC | PRN

## 2016-01-04 MED ORDER — MECLIZINE HCL 25 MG PO TABS
25.0000 mg | ORAL_TABLET | Freq: Once | ORAL | Status: AC
  Administered 2016-01-04: 25 mg via ORAL
  Filled 2016-01-04 (×2): qty 1

## 2016-01-04 NOTE — ED Provider Notes (Signed)
[facility] Emergency Department Provider Note    ____________________________________________  Time seen: ~1540  I have reviewed the triage vital signs and the nursing notes.   HISTORY  Chief Complaint Dizziness   History limited by: Not Limited   HPI Melissa Sparks is a 50 y.o. female who presents to the emergency department today because of concerns for headache and dizziness. She states that she has been having a headache for the past 3 days. It has been fairly constant. It is located in the front of her head. She states that during this time she has had intermittent episodes of dizziness. She feels like the room is spinning around her. She also had some paresthesias in her arms however states that these symptoms have improved. At the time of my examination she stated that she felt a little off but otherwise most of her symptoms had gone away. She denies any head trauma. Denies any fevers. States she had similar symptoms last year and was evaluated by a neurologist.     Past Medical History  Diagnosis Date  . Hypertension   . Anemia     Patient Active Problem List   Diagnosis Date Noted  . S/P hysterectomy 07/06/2015    Past Surgical History  Procedure Laterality Date  . Tubal ligation  1987  . Hysteroscopy w/d&c  06/11/2015    Procedure: DILATATION AND CURETTAGE /HYSTEROSCOPY;  Surgeon: Honor Loh Ward, MD;  Location: [facility] ORS;  Service: Gynecology;;  . Laparoscopic hysterectomy N/A 07/06/2015    Procedure: HYSTERECTOMY TOTAL LAPAROSCOPIC;  Surgeon: Honor Loh Ward, MD;  Location: [facility] ORS;  Service: Gynecology;  Laterality: N/A;  . Laparoscopic salpingo oopherectomy Bilateral 07/06/2015    Procedure: LAPAROSCOPIC SALPINGO OOPHORECTOMY;  Surgeon: Honor Loh Ward, MD;  Location: [facility] ORS;  Service: Gynecology;  Laterality: Bilateral;    Current Outpatient Rx  Name  Route  Sig  Dispense  Refill  . acetaminophen (TYLENOL) 325 MG tablet   Oral    Take 2 tablets (650 mg total) by mouth every 4 (four) hours.   60 tablet      . amoxicillin (AMOXIL) 500 MG capsule   Oral   Take 1 capsule (500 mg total) by mouth 2 (two) times daily.   10 capsule   0   . aspirin 81 MG tablet   Oral   Take 81 mg by mouth daily.         Marland Kitchen ibuprofen (ADVIL,MOTRIN) 600 MG tablet   Oral   Take 1 tablet (600 mg total) by mouth every 6 (six) hours as needed.   45 tablet   0   . losartan-hydrochlorothiazide (HYZAAR) 100-25 MG per tablet   Oral   Take 1 tablet by mouth daily.         . meloxicam (MOBIC) 15 MG tablet   Oral   Take 15 mg by mouth daily.         . metoprolol (LOPRESSOR) 50 MG tablet   Oral   Take 50 mg by mouth 2 (two) times daily.         . traMADol (ULTRAM) 50 MG tablet   Oral   Take 1 tablet (50 mg total) by mouth every 6 (six) hours as needed for moderate pain.   45 tablet   0     Allergies Latex  No family history on file.  Social History Social History  Substance Use Topics  . Smoking status: Former Smoker -- 2.00 packs/day for 30 years  Types: Cigarettes    Quit date: 03/09/2015  . Smokeless tobacco: Never Used  . Alcohol Use: No    Review of Systems  Constitutional: Negative for fever. Cardiovascular: Negative for chest pain. Respiratory: Negative for shortness of breath. Gastrointestinal: Negative for abdominal pain, vomiting and diarrhea. Neurological: Positive for headache.   10-point ROS otherwise negative.  ____________________________________________   PHYSICAL EXAM:  VITAL SIGNS: ED Triage Vitals  Enc Vitals Group     BP 01/04/16 0959 127/73 mmHg     Pulse Rate 01/04/16 0959 63     Resp 01/04/16 0959 16     Temp 01/04/16 0959 97.6 F (36.4 C)     Temp Source 01/04/16 0959 Oral     SpO2 01/04/16 0959 99 %     Weight 01/04/16 0959 270 lb (122.471 kg)     Height 01/04/16 0959 5\' 7"  (1.702 m)     Head Cir --      Peak Flow --      Pain Score 01/04/16 1000 7    Constitutional: Alert and oriented. Well appearing and in no distress. Eyes: Conjunctivae are normal. PERRL. Normal extraocular movements. No papilledema appreciated on funduscopic exam. ENT   Head: Normocephalic and atraumatic.   Nose: No congestion/rhinnorhea.   Mouth/Throat: Mucous membranes are moist.   Neck: No stridor. Hematological/Lymphatic/Immunilogical: No cervical lymphadenopathy. Cardiovascular: Normal rate, regular rhythm.  No murmurs, rubs, or gallops. Respiratory: Normal respiratory effort without tachypnea nor retractions. Breath sounds are clear and equal bilaterally. No wheezes/rales/rhonchi. Gastrointestinal: Soft and nontender. No distention. There is no CVA tenderness. Genitourinary: Deferred Musculoskeletal: Normal range of motion in all extremities. No joint effusions.  No lower extremity tenderness nor edema. Neurologic:  Normal speech and language. No gross focal neurologic deficits are appreciated.  Skin:  Skin is warm, dry and intact. No rash noted. Psychiatric: Mood and affect are normal. Speech and behavior are normal. Patient exhibits appropriate insight and judgment.  ____________________________________________    LABS (pertinent positives/negatives)  Labs Reviewed  BASIC METABOLIC PANEL - Abnormal; Notable for the following:    Glucose, Bld 104 (*)    All other components within normal limits  URINALYSIS COMPLETEWITH MICROSCOPIC ([facility] ONLY) - Abnormal; Notable for the following:    Color, Urine YELLOW (*)    APPearance CLEAR (*)    Hgb urine dipstick 2+ (*)    Bacteria, UA RARE (*)    Squamous Epithelial / LPF 6-30 (*)    All other components within normal limits  CBC     ____________________________________________   EKG  I, Nance Pear, attending physician, personally viewed and interpreted this EKG  EKG Time: 0959 Rate: 69 Rhythm: normal sinus rhythm Axis: normal Intervals: qtc 447 QRS: narrow, q waves V1 ST  changes: no st elevation Impression: abnormal ekg  ____________________________________________    RADIOLOGY  None  ____________________________________________   PROCEDURES  Procedure(s) performed: None  Critical Care performed: No  ____________________________________________   INITIAL IMPRESSION / ASSESSMENT AND PLAN / ED COURSE  Pertinent labs & imaging results that were available during my care of the patient were reviewed by me and considered in my medical decision making (see chart for details).  Patient presented to the emergency department today because of concerns for headache, dizziness and some paresthesias. At this time I examined the patient's symptoms had mostly resolved on their own accord. I did give the patient some meclizine which appeared to help with her symptoms according to the patient. On chart review it appears that  the patient had had a sleep study test which showed obstructive sleep apnea however the patient has not been put on a CPAP. Additionally there was some concern for pseudotumor however no papilledema. On today's exam and waxing and waning of symptoms makes me think that papilledema would be less likely at this point. Will plan on discharging with meclizine. Did discuss with the patient importance of following up with Dr. Manuella Ghazi.  ____________________________________________   FINAL CLINICAL IMPRESSION(S) / ED DIAGNOSES  Final diagnoses:  Dizziness  Headache, unspecified headache type     Nance Pear, MD 01/04/16 1656

## 2016-01-04 NOTE — ED Notes (Signed)
Says headache 3 days.  Tingling in both arms.  And abuzzing feeling.  Says she has beens ent to neurologist for pituitary problems in past.  Dizziness as well

## 2016-01-04 NOTE — Discharge Instructions (Signed)
Please seek medical attention for any high fevers, chest pain, shortness of breath, change in behavior, persistent vomiting, bloody stool or any other new or concerning symptoms. ° ° °Dizziness °Dizziness is a common problem. It is a feeling of unsteadiness or light-headedness. You may feel like you are about to faint. Dizziness can lead to injury if you stumble or fall. Anyone can become dizzy, but dizziness is more common in older adults. This condition can be caused by a number of things, including medicines, dehydration, or illness. °HOME CARE INSTRUCTIONS °Taking these steps may help with your condition: °Eating and Drinking °· Drink enough fluid to keep your urine clear or pale yellow. This helps to keep you from becoming dehydrated. Try to drink more clear fluids, such as water. °· Do not drink alcohol. °· Limit your caffeine intake if directed by your health care provider. °· Limit your salt intake if directed by your health care provider. °Activity °· Avoid making quick movements. °¨ Rise slowly from chairs and steady yourself until you feel okay. °¨ In the morning, first sit up on the side of the bed. When you feel okay, stand slowly while you hold onto something until you know that your balance is fine. °· Move your legs often if you need to stand in one place for a long time. Tighten and relax your muscles in your legs while you are standing. °· Do not drive or operate heavy machinery if you feel dizzy. °· Avoid bending down if you feel dizzy. Place items in your home so that they are easy for you to reach without leaning over. °Lifestyle °· Do not use any tobacco products, including cigarettes, chewing tobacco, or electronic cigarettes. If you need help quitting, ask your health care provider. °· Try to reduce your stress level, such as with yoga or meditation. Talk with your health care provider if you need help. °General Instructions °· Watch your dizziness for any changes. °· Take medicines only as  directed by your health care provider. Talk with your health care provider if you think that your dizziness is caused by a medicine that you are taking. °· Tell a friend or a family member that you are feeling dizzy. If he or she notices any changes in your behavior, have this person call your health care provider. °· Keep all follow-up visits as directed by your health care provider. This is important. °SEEK MEDICAL CARE IF: °· Your dizziness does not go away. °· Your dizziness or light-headedness gets worse. °· You feel nauseous. °· You have reduced hearing. °· You have new symptoms. °· You are unsteady on your feet or you feel like the room is spinning. °SEEK IMMEDIATE MEDICAL CARE IF: °· You vomit or have diarrhea and are unable to eat or drink anything. °· You have problems talking, walking, swallowing, or using your arms, hands, or legs. °· You feel generally weak. °· You are not thinking clearly or you have trouble forming sentences. It may take a friend or family member to notice this. °· You have chest pain, abdominal pain, shortness of breath, or sweating. °· Your vision changes. °· You notice any bleeding. °· You have a headache. °· You have neck pain or a stiff neck. °· You have a fever. °  °This information is not intended to replace advice given to you by your health care provider. Make sure you discuss any questions you have with your health care provider. °  °Document Released: 05/16/2001 Document Revised: 04/06/2015   Document Reviewed: 11/16/2014 °Elsevier Interactive Patient Education ©2016 [employer] ° °

## 2016-01-08 ENCOUNTER — Encounter: Payer: Self-pay | Admitting: Emergency Medicine

## 2016-01-08 ENCOUNTER — Emergency Department
Admission: EM | Admit: 2016-01-08 | Discharge: 2016-01-08 | Disposition: A | Discharge status: Home / Self Care | Payer: BLUE CROSS/BLUE SHIELD | Attending: Emergency Medicine | Admitting: Emergency Medicine

## 2016-01-08 DIAGNOSIS — S99921A Unspecified injury of right foot, initial encounter: Secondary | ICD-10-CM | POA: Diagnosis present

## 2016-01-08 DIAGNOSIS — Z87891 Personal history of nicotine dependence: Secondary | ICD-10-CM | POA: Diagnosis not present

## 2016-01-08 DIAGNOSIS — Z7982 Long term (current) use of aspirin: Secondary | ICD-10-CM | POA: Insufficient documentation

## 2016-01-08 DIAGNOSIS — I10 Essential (primary) hypertension: Secondary | ICD-10-CM | POA: Insufficient documentation

## 2016-01-08 DIAGNOSIS — Y9389 Activity, other specified: Secondary | ICD-10-CM | POA: Insufficient documentation

## 2016-01-08 DIAGNOSIS — Z792 Long term (current) use of antibiotics: Secondary | ICD-10-CM | POA: Diagnosis not present

## 2016-01-08 DIAGNOSIS — Z91040 Latex allergy status: Secondary | ICD-10-CM | POA: Diagnosis not present

## 2016-01-08 DIAGNOSIS — Y9289 Other specified places as the place of occurrence of the external cause: Secondary | ICD-10-CM | POA: Diagnosis not present

## 2016-01-08 DIAGNOSIS — Z791 Long term (current) use of non-steroidal anti-inflammatories (NSAID): Secondary | ICD-10-CM | POA: Insufficient documentation

## 2016-01-08 DIAGNOSIS — S90111A Contusion of right great toe without damage to nail, initial encounter: Secondary | ICD-10-CM | POA: Diagnosis not present

## 2016-01-08 DIAGNOSIS — Z79899 Other long term (current) drug therapy: Secondary | ICD-10-CM | POA: Insufficient documentation

## 2016-01-08 DIAGNOSIS — Y990 Civilian activity done for income or pay: Secondary | ICD-10-CM | POA: Insufficient documentation

## 2016-01-08 DIAGNOSIS — W228XXA Striking against or struck by other objects, initial encounter: Secondary | ICD-10-CM | POA: Insufficient documentation

## 2016-01-08 MED ORDER — NAPROXEN 500 MG PO TABS: 500.0000 mg | ORAL_TABLET | Freq: Two times a day (BID) | ORAL | Status: DC

## 2016-01-08 NOTE — Discharge Instructions (Signed)

## 2016-01-08 NOTE — ED Provider Notes (Signed)
[facility] Emergency Department Provider Note  ____________________________________________  Time seen: Approximately 8:09 AM  I have reviewed the triage vital signs and the nursing notes.   HISTORY  Chief Complaint Toe Pain    HPI Melissa Sparks is a 50 y.o. female who presents emergency department complaining of right great toe pain. Per the patient she was at work when she accidentally kicked the corner of the machine causing pain to her right great toe. She thought initially she did just bruised it. She states that she has some redness and swelling along the lateral aspect ofher toe. She states that she accidentally kicked into again as well as have her dog stepped on same area. Pain is located on the lateral aspect of the nail. She denies any drainage site. She denies any history of ingrown toenails toe. Patient states that the pain is sharp with pressure.   Past Medical History  Diagnosis Date  . Hypertension   . Anemia     Patient Active Problem List   Diagnosis Date Noted  . S/P hysterectomy 07/06/2015    Past Surgical History  Procedure Laterality Date  . Tubal ligation  1987  . Hysteroscopy w/d&c  06/11/2015    Procedure: DILATATION AND CURETTAGE /HYSTEROSCOPY;  Surgeon: Honor Loh Ward, MD;  Location: [facility] ORS;  Service: Gynecology;;  . Laparoscopic hysterectomy N/A 07/06/2015    Procedure: HYSTERECTOMY TOTAL LAPAROSCOPIC;  Surgeon: Honor Loh Ward, MD;  Location: [facility] ORS;  Service: Gynecology;  Laterality: N/A;  . Laparoscopic salpingo oopherectomy Bilateral 07/06/2015    Procedure: LAPAROSCOPIC SALPINGO OOPHORECTOMY;  Surgeon: Honor Loh Ward, MD;  Location: [facility] ORS;  Service: Gynecology;  Laterality: Bilateral;    Current Outpatient Rx  Name  Route  Sig  Dispense  Refill  . acetaminophen (TYLENOL) 325 MG tablet   Oral   Take 2 tablets (650 mg total) by mouth every 4 (four) hours.   60 tablet      . amoxicillin (AMOXIL) 500 MG  capsule   Oral   Take 1 capsule (500 mg total) by mouth 2 (two) times daily.   10 capsule   0   . aspirin 81 MG tablet   Oral   Take 81 mg by mouth daily.         Marland Kitchen ibuprofen (ADVIL,MOTRIN) 600 MG tablet   Oral   Take 1 tablet (600 mg total) by mouth every 6 (six) hours as needed.   45 tablet   0   . losartan-hydrochlorothiazide (HYZAAR) 100-25 MG per tablet   Oral   Take 1 tablet by mouth daily.         . meclizine (ANTIVERT) 25 MG tablet   Oral   Take 1 tablet (25 mg total) by mouth 3 (three) times daily as needed for dizziness.   20 tablet   0   . meloxicam (MOBIC) 15 MG tablet   Oral   Take 15 mg by mouth daily.         . metoprolol (LOPRESSOR) 50 MG tablet   Oral   Take 50 mg by mouth 2 (two) times daily.         . naproxen (NAPROSYN) 500 MG tablet   Oral   Take 1 tablet (500 mg total) by mouth 2 (two) times daily with a meal.   60 tablet   0   . traMADol (ULTRAM) 50 MG tablet   Oral   Take 1 tablet (50 mg total) by mouth every 6 (  six) hours as needed for moderate pain.   45 tablet   0     Allergies Latex  History reviewed. No pertinent family history.  Social History Social History  Substance Use Topics  . Smoking status: Former Smoker -- 2.00 packs/day for 30 years    Types: Cigarettes    Quit date: 03/09/2015  . Smokeless tobacco: Never Used  . Alcohol Use: No     Review of Systems  Constitutional: No fever/chills Cardiovascular: no chest pain. Respiratory: no cough. No SOB. Musculoskeletal: Negative for back pain. Positive for right great toe pain. Skin: Negative for rash. Neurological: Negative for headaches, focal weakness or numbness. 10-point ROS otherwise negative.  ____________________________________________   PHYSICAL EXAM:  VITAL SIGNS: ED Triage Vitals  Enc Vitals Group     BP 01/08/16 0803 116/46 mmHg     Pulse Rate 01/08/16 0803 85     Resp 01/08/16 0803 20     Temp 01/08/16 0803 97.9 F (36.6 C)      Temp Source 01/08/16 0803 Oral     SpO2 01/08/16 0803 96 %     Weight 01/08/16 0803 270 lb (122.471 kg)     Height 01/08/16 0803 5\' 7"  (1.702 m)     Head Cir --      Peak Flow --      Pain Score 01/08/16 0806 10     Pain Loc --      Pain Edu? --      Excl. in [place]? --      Constitutional: Alert and oriented. Well appearing and in no acute distress. Eyes: Conjunctivae are normal. PERRL. EOMI. Head: Atraumatic. Neck: No stridor.   Cardiovascular: Normal rate, regular rhythm. Normal S1 and S2.  Good peripheral circulation. Respiratory: Normal respiratory effort without tachypnea or retractions. Lungs CTAB. Musculoskeletal:   No joint effusions. No visible deformity to right great toe when compared with the unaffected extremity. There is minor edema noted to the lateral aspect of the great toe. There is no warmth to palpation. Patient has full range of motion of toe. The soft tissue edema is surrounding the lateral aspect of the nailbed. No purulent drainage from the area. No subungual hematoma.. Area is very tender palpation. Sensation intact distal aspect of the toe. Neurologic:  Normal speech and language. No gross focal neurologic deficits are appreciated.  Skin:  Skin is warm, dry and intact. No rash noted. Psychiatric: Mood and affect are normal. Speech and behavior are normal. Patient exhibits appropriate insight and judgement.   ____________________________________________   LABS (all labs ordered are listed, but only abnormal results are displayed)  Labs Reviewed - No data to display ____________________________________________  EKG   ____________________________________________  RADIOLOGY   No results found.  ____________________________________________    PROCEDURES  Procedure(s) performed:       Medications - No data to display   ____________________________________________   INITIAL IMPRESSION / ASSESSMENT AND PLAN / ED COURSE  Pertinent labs &  imaging results that were available during my care of the patient were reviewed by me and considered in my medical decision making (see chart for details).  Patient's diagnosis is consistent with right great toe contusion. Exam is most consistent with a contusion to the lateral aspect of the right great toe that has caused some soft tissue swelling over the lateral nail edge. At this time it does not appear that the nail is ingrown or infected.. Patient will be discharged home with prescriptions for anti-inflammatories for symptom  control. Patient is to follow up with podiatry if symptoms persist past this treatment course. Patient is given ED precautions to return to the ED for any worsening or new symptoms.     ____________________________________________  FINAL CLINICAL IMPRESSION(S) / ED DIAGNOSES  Final diagnoses:  Contusion of great toe of right foot, initial encounter      NEW MEDICATIONS STARTED DURING THIS VISIT:  New Prescriptions   NAPROXEN (NAPROSYN) 500 MG TABLET    Take 1 tablet (500 mg total) by mouth 2 (two) times daily with a meal.        Darletta Moll, PA-C 01/08/16 [id number]  Lavonia Drafts, MD 01/08/16 [phone]

## 2016-01-08 NOTE — ED Notes (Signed)
Pt to ed with c/o right foot toe pain x 4 days.  Pt states she hit it at work and since has had increased pain with walking.  Reports "i have not slept in 2 days"

## 2016-03-27 ENCOUNTER — Ambulatory Visit: Payer: BLUE CROSS/BLUE SHIELD | Admitting: Family Medicine

## 2016-05-29 ENCOUNTER — Other Ambulatory Visit: Payer: Self-pay | Admitting: Podiatry

## 2016-05-29 DIAGNOSIS — M76821 Posterior tibial tendinitis, right leg: Secondary | ICD-10-CM

## 2016-06-02 ENCOUNTER — Ambulatory Visit: Payer: BLUE CROSS/BLUE SHIELD

## 2016-06-07 ENCOUNTER — Emergency Department
Admission: EM | Admit: 2016-06-07 | Discharge: 2016-06-07 | Disposition: A | Discharge status: Home / Self Care | Payer: BLUE CROSS/BLUE SHIELD | Attending: Emergency Medicine | Admitting: Emergency Medicine

## 2016-06-07 ENCOUNTER — Emergency Department: Payer: BLUE CROSS/BLUE SHIELD

## 2016-06-07 DIAGNOSIS — Z7982 Long term (current) use of aspirin: Secondary | ICD-10-CM | POA: Insufficient documentation

## 2016-06-07 DIAGNOSIS — M545 Low back pain, unspecified: Secondary | ICD-10-CM

## 2016-06-07 DIAGNOSIS — Z87891 Personal history of nicotine dependence: Secondary | ICD-10-CM | POA: Diagnosis not present

## 2016-06-07 DIAGNOSIS — I10 Essential (primary) hypertension: Secondary | ICD-10-CM | POA: Diagnosis not present

## 2016-06-07 DIAGNOSIS — Z91040 Latex allergy status: Secondary | ICD-10-CM | POA: Insufficient documentation

## 2016-06-07 DIAGNOSIS — Z79899 Other long term (current) drug therapy: Secondary | ICD-10-CM | POA: Diagnosis not present

## 2016-06-07 DIAGNOSIS — Z791 Long term (current) use of non-steroidal anti-inflammatories (NSAID): Secondary | ICD-10-CM | POA: Insufficient documentation

## 2016-06-07 DIAGNOSIS — G5701 Lesion of sciatic nerve, right lower limb: Secondary | ICD-10-CM

## 2016-06-07 DIAGNOSIS — M5441 Lumbago with sciatica, right side: Secondary | ICD-10-CM | POA: Insufficient documentation

## 2016-06-07 MED ORDER — ONDANSETRON HCL 4 MG/2ML IJ SOLN
INTRAMUSCULAR | Status: AC
  Administered 2016-06-07: 4 mg via INTRAVENOUS
  Filled 2016-06-07: qty 2

## 2016-06-07 MED ORDER — ONDANSETRON HCL 4 MG/2ML IJ SOLN
4.0000 mg | Freq: Once | INTRAMUSCULAR | Status: AC
  Administered 2016-06-07: 4 mg via INTRAVENOUS

## 2016-06-07 MED ORDER — MORPHINE SULFATE (PF) 4 MG/ML IV SOLN
INTRAVENOUS | Status: AC
  Administered 2016-06-07: 4 mg via INTRAVENOUS
  Filled 2016-06-07: qty 1

## 2016-06-07 MED ORDER — TRAMADOL HCL 50 MG PO TABS: 50.0000 mg | ORAL_TABLET | Freq: Four times a day (QID) | ORAL | Status: DC | PRN

## 2016-06-07 MED ORDER — MORPHINE SULFATE (PF) 4 MG/ML IV SOLN
4.0000 mg | Freq: Once | INTRAVENOUS | Status: AC
  Administered 2016-06-07: 4 mg via INTRAVENOUS

## 2016-06-07 MED ORDER — IBUPROFEN 800 MG PO TABS: 800.0000 mg | ORAL_TABLET | Freq: Three times a day (TID) | ORAL | Status: DC | PRN

## 2016-06-07 NOTE — ED Notes (Signed)
Pt has pain to right foot that she has an MRI scheduled for today but since yesterday at 1pm her lower back has been hurting and radiating into both legs (worse in right leg) - Pt denies frequency with urination and denies pain with urination - Pt states the pain started when she stood up to go to the bathroom but she had no injury that she remembers

## 2016-06-07 NOTE — ED Provider Notes (Signed)
-----------------------------------------   10:13 AM on 06/07/2016 -----------------------------------------   Blood pressure 128/83, pulse 65, temperature 98.2 F (36.8 C), temperature source Oral, resp. rate 18, height 5\' 7"  (1.702 m), weight 270 lb (122.471 kg), last menstrual period 06/28/2015, SpO2 99 %.  Assuming care from Dr. Owens Shark.  In short, Melissa Sparks is a 50 y.o. female with a chief complaint of Back Pain .  Refer to the original H&P for additional details.  The current plan of care is to follow results of the MRI which did not show any significant lumbar disc disease. Mild herniation of the fifth disc with some mild compression of the nerve no evidence of cauda equina syndrome. Patient was given copies of her MRI to take with her be discharged with over-the-counter ibuprofen and a prescription for Ultram for pain.Daymon Larsen, MD 06/07/16 [phone]

## 2016-06-07 NOTE — ED Provider Notes (Signed)
[facility] Emergency Department Provider Note  ____________________________________________  Time seen: 6:30 AM  I have reviewed the triage vital signs and the nursing notes.   HISTORY  Chief Complaint Back Pain     HPI Melissa Sparks is a 50 y.o. female presents with acute onset of nontraumatic 10 out of 10 low back pain with onset yesterday. Patient states that she was attempting to get out of bed when she suddenly started having low back pain radiating down the posterior portion of her right buttocks and leg. Patient denies any change in urinary or bowel habits. Patient denies any lower extremity weakness. Pain worsened with active and passive range of motion and palpation of the lumbar spine    Past Medical History  Diagnosis Date  . Hypertension   . Anemia     Patient Active Problem List   Diagnosis Date Noted  . S/P hysterectomy 07/06/2015    Past Surgical History  Procedure Laterality Date  . Tubal ligation  1987  . Hysteroscopy w/d&c  06/11/2015    Procedure: DILATATION AND CURETTAGE /HYSTEROSCOPY;  Surgeon: Honor Loh Ward, MD;  Location: [facility] ORS;  Service: Gynecology;;  . Laparoscopic hysterectomy N/A 07/06/2015    Procedure: HYSTERECTOMY TOTAL LAPAROSCOPIC;  Surgeon: Honor Loh Ward, MD;  Location: [facility] ORS;  Service: Gynecology;  Laterality: N/A;  . Laparoscopic salpingo oopherectomy Bilateral 07/06/2015    Procedure: LAPAROSCOPIC SALPINGO OOPHORECTOMY;  Surgeon: Honor Loh Ward, MD;  Location: [facility] ORS;  Service: Gynecology;  Laterality: Bilateral;    Current Outpatient Rx  Name  Route  Sig  Dispense  Refill  . acetaminophen (TYLENOL) 325 MG tablet   Oral   Take 2 tablets (650 mg total) by mouth every 4 (four) hours.   60 tablet      . amoxicillin (AMOXIL) 500 MG capsule   Oral   Take 1 capsule (500 mg total) by mouth 2 (two) times daily.   10 capsule   0   . aspirin 81 MG tablet   Oral   Take 81 mg by mouth daily.          Marland Kitchen ibuprofen (ADVIL,MOTRIN) 600 MG tablet   Oral   Take 1 tablet (600 mg total) by mouth every 6 (six) hours as needed.   45 tablet   0   . losartan-hydrochlorothiazide (HYZAAR) 100-25 MG per tablet   Oral   Take 1 tablet by mouth daily.         . meclizine (ANTIVERT) 25 MG tablet   Oral   Take 1 tablet (25 mg total) by mouth 3 (three) times daily as needed for dizziness.   20 tablet   0   . meloxicam (MOBIC) 15 MG tablet   Oral   Take 15 mg by mouth daily.         . metoprolol (LOPRESSOR) 50 MG tablet   Oral   Take 50 mg by mouth 2 (two) times daily.         . naproxen (NAPROSYN) 500 MG tablet   Oral   Take 1 tablet (500 mg total) by mouth 2 (two) times daily with a meal.   60 tablet   0   . traMADol (ULTRAM) 50 MG tablet   Oral   Take 1 tablet (50 mg total) by mouth every 6 (six) hours as needed for moderate pain.   45 tablet   0     Allergies Latex  No family history on file.  Social History  Social History  Substance Use Topics  . Smoking status: Former Smoker -- 2.00 packs/day for 30 years    Types: Cigarettes    Quit date: 03/09/2015  . Smokeless tobacco: Never Used  . Alcohol Use: No    Review of Systems  Constitutional: Negative for fever. Eyes: Negative for visual changes. ENT: Negative for sore throat. Cardiovascular: Negative for chest pain. Respiratory: Negative for shortness of breath. Gastrointestinal: Negative for abdominal pain, vomiting and diarrhea. Genitourinary: Negative for dysuria. Musculoskeletal: Positive for back pain. Skin: Negative for rash. Neurological: Negative for headaches, focal weakness or numbness.   10-point ROS otherwise negative.  ____________________________________________   PHYSICAL EXAM:  VITAL SIGNS: ED Triage Vitals  Enc Vitals Group     BP 06/07/16 0557 121/58 mmHg     Pulse Rate 06/07/16 0557 79     Resp 06/07/16 0557 20     Temp 06/07/16 0557 98.2 F (36.8 C)     Temp Source 06/07/16  0557 Oral     SpO2 06/07/16 0557 99 %     Weight 06/07/16 0557 270 lb (122.471 kg)     Height 06/07/16 0557 5\' 7"  (1.702 m)     Head Cir --      Peak Flow --      Pain Score 06/07/16 0602 10     Pain Loc --      Pain Edu? --      Excl. in [place]? --      Constitutional: Alert and oriented. Well appearing and in no distress. Eyes: Conjunctivae are normal. PERRL. Normal extraocular movements. ENT   Head: Normocephalic and atraumatic.   Nose: No congestion/rhinnorhea.   Mouth/Throat: Mucous membranes are moist.   Neck: No stridor. Hematological/Lymphatic/Immunilogical: No cervical lymphadenopathy. Cardiovascular: Normal rate, regular rhythm. Normal and symmetric distal pulses are present in all extremities. No murmurs, rubs, or gallops. Respiratory: Normal respiratory effort without tachypnea nor retractions. Breath sounds are clear and equal bilaterally. No wheezes/rales/rhonchi. Gastrointestinal: Soft and nontender. No distention. There is no CVA tenderness. Genitourinary: deferred Musculoskeletal: Nontender with normal range of motion in all extremities. No joint effusions.  No lower extremity tenderness nor edema.Pain with palpation L2-5. Pain also with lumbar paraspinal muscle palpation. Neurologic:  Normal speech and language. No gross focal neurologic deficits are appreciated. Speech is normal.  Skin:  Skin is warm, dry and intact. No rash noted. Psychiatric: Mood and affect are normal. Speech and behavior are normal. Patient exhibits appropriate insight and judgment.       Procedures     INITIAL IMPRESSION / ASSESSMENT AND PLAN / ED COURSE  Pertinent labs & imaging results that were available during my care of the patient were reviewed by me and considered in my medical decision making (see chart for details).  Suspect lumbar radiculopathy for the etiology of patient's pain. Patient given IV morphine and Zofran emergency department. Patient's care transferred  to Dr. Marcelene Butte pending MRI results  ____________________________________________   FINAL CLINICAL IMPRESSION(S) / ED DIAGNOSES  Final diagnoses:  Lumbar spine pain      Gregor Hams, MD 06/08/16 2253

## 2016-06-07 NOTE — ED Notes (Signed)
Pt in with co lower back pain unsure of injury started yesterday. Pain worse on movement or ambulation, with radiation to hips and buttocks.

## 2016-06-08 ENCOUNTER — Ambulatory Visit: Payer: BLUE CROSS/BLUE SHIELD

## 2016-06-12 ENCOUNTER — Ambulatory Visit
Admission: RE | Admit: 2016-06-12 | Discharge: 2016-06-12 | Disposition: A | Discharge status: Home / Self Care | Payer: BLUE CROSS/BLUE SHIELD | Source: Ambulatory Visit | Attending: Podiatry | Admitting: Podiatry

## 2016-06-12 DIAGNOSIS — M659 Synovitis and tenosynovitis, unspecified: Secondary | ICD-10-CM | POA: Diagnosis not present

## 2016-06-12 DIAGNOSIS — M76821 Posterior tibial tendinitis, right leg: Secondary | ICD-10-CM | POA: Diagnosis present

## 2016-06-12 DIAGNOSIS — M899 Disorder of bone, unspecified: Secondary | ICD-10-CM | POA: Insufficient documentation

## 2016-06-30 ENCOUNTER — Encounter
Admission: RE | Admit: 2016-06-30 | Discharge: 2016-06-30 | Disposition: A | Discharge status: Home / Self Care | Payer: BLUE CROSS/BLUE SHIELD | Source: Ambulatory Visit | Attending: Podiatry | Admitting: Podiatry

## 2016-06-30 DIAGNOSIS — Z01812 Encounter for preprocedural laboratory examination: Secondary | ICD-10-CM | POA: Insufficient documentation

## 2016-06-30 LAB — BASIC METABOLIC PANEL
ANION GAP: 8 (ref 5–15)
BUN: 17 mg/dL (ref 6–20)
CHLORIDE: 101 mmol/L (ref 101–111)
CO2: 28 mmol/L (ref 22–32)
CREATININE: 0.71 mg/dL (ref 0.44–1.00)
Calcium: 9.1 mg/dL (ref 8.9–10.3)
GFR calc non Af Amer: >60 mL/min (ref >60)
Glucose, Bld: 90 mg/dL (ref 65–99)
POTASSIUM: 4.1 mmol/L (ref 3.5–5.1)
SODIUM: 137 mmol/L (ref 135–145)

## 2016-06-30 LAB — CBC
HEMATOCRIT: 38.7 % (ref 35.0–47.0)
HEMOGLOBIN: 13.2 g/dL (ref 12.0–16.0)
MCH: 29.4 pg (ref 26.0–34.0)
MCHC: 34.2 g/dL (ref 32.0–36.0)
MCV: 85.9 fL (ref 80.0–100.0)
Platelets: 249 10*3/uL (ref 150–440)
RBC: 4.51 MIL/uL (ref 3.80–5.20)
RDW: 13.9 % (ref 11.5–14.5)
WBC: 5.1 10*3/uL (ref 3.6–11.0)

## 2016-06-30 NOTE — Patient Instructions (Signed)
  Your procedure is scheduled [id number] August 4 , 2017. Report to Same Day Surgery. To find out your arrival time please call [phone] between 1PM - 3PM on Thursday July 06, 2016.  Remember: Instructions that are not followed completely may result in serious medical risk, up to and including death, or upon the discretion of your surgeon and anesthesiologist your surgery may need to be rescheduled.    _x___ 1. Do not eat food or drink liquids after midnight. No gum chewing or  hard candies.     ____ 2. No Alcohol for 24 hours before or after surgery.   ____ 3. Bring all medications with you on the day of surgery if instructed.    __x__ 4. Notify your doctor if there is any change in your medical condition     (cold, fever, infections).     Do not wear jewelry, make-up, hairpins, clips or nail polish.  Do not wear lotions, powders, or perfumes. You may wear deodorant.  Do not shave 48 hours prior to surgery. Men may shave face and neck.  Do not bring valuables to the hospital.    [facility] is not responsible for any belongings or valuables.               Contacts, dentures or bridgework may not be worn into surgery.  Leave your suitcase in the car. After surgery it may be brought to your room.  For patients admitted to the hospital, discharge time is determined by your treatment team.   Patients discharged the day of surgery will not be allowed to drive home.    Please read over the following fact sheets that you were given:   [facility] Preparing for Surgery  _x___ Take these medicines the morning of surgery with A SIP OF WATER:    1. metoprolol (LOPRESSOR)    ____ Fleet Enema (as directed)   __x__ Use CHG Soap as directed on instruction sheet  ____ Use inhalers on the day of surgery and bring to hospital day of surgery  ____ Stop metformin 2 days prior to surgery    ____ Take 1/2 of usual insulin dose the night before surgery and none on the morning of surgery.    ____ Stop Coumadin/Plavix/aspirin on does not apply.  __x__ Stop Anti-inflammatories such as Advil, Aleve, Ibuprofen, Motrin, Naproxen, Naprosyn, Goodies powders or aspirin products.OK to take Tylenol or Tramadol.   ____ Stop supplements until after surgery.    ____ Bring C-Pap to the hospital.

## 2016-07-07 ENCOUNTER — Ambulatory Visit: Payer: BLUE CROSS/BLUE SHIELD

## 2016-07-07 ENCOUNTER — Encounter: Payer: Self-pay | Admitting: Anesthesiology

## 2016-07-07 ENCOUNTER — Ambulatory Visit
Admission: RE | Admit: 2016-07-07 | Discharge: 2016-07-07 | Disposition: A | Discharge status: Home / Self Care | Payer: BLUE CROSS/BLUE SHIELD | Source: Ambulatory Visit | Attending: Podiatry | Admitting: Podiatry

## 2016-07-07 ENCOUNTER — Encounter
Admission: RE | Disposition: A | Discharge status: Home / Self Care | Payer: Self-pay | Source: Ambulatory Visit | Attending: Podiatry

## 2016-07-07 ENCOUNTER — Ambulatory Visit: Payer: BLUE CROSS/BLUE SHIELD | Admitting: Anesthesiology

## 2016-07-07 DIAGNOSIS — F17210 Nicotine dependence, cigarettes, uncomplicated: Secondary | ICD-10-CM | POA: Insufficient documentation

## 2016-07-07 DIAGNOSIS — M1712 Unilateral primary osteoarthritis, left knee: Secondary | ICD-10-CM | POA: Insufficient documentation

## 2016-07-07 DIAGNOSIS — Z833 Family history of diabetes mellitus: Secondary | ICD-10-CM | POA: Insufficient documentation

## 2016-07-07 DIAGNOSIS — I10 Essential (primary) hypertension: Secondary | ICD-10-CM | POA: Insufficient documentation

## 2016-07-07 DIAGNOSIS — Z6841 Body Mass Index (BMI) 40.0 and over, adult: Secondary | ICD-10-CM | POA: Diagnosis not present

## 2016-07-07 DIAGNOSIS — Z888 Allergy status to other drugs, medicaments and biological substances status: Secondary | ICD-10-CM | POA: Diagnosis not present

## 2016-07-07 DIAGNOSIS — Z87891 Personal history of nicotine dependence: Secondary | ICD-10-CM | POA: Diagnosis not present

## 2016-07-07 DIAGNOSIS — M76829 Posterior tibial tendinitis, unspecified leg: Secondary | ICD-10-CM | POA: Diagnosis present

## 2016-07-07 DIAGNOSIS — D649 Anemia, unspecified: Secondary | ICD-10-CM | POA: Diagnosis not present

## 2016-07-07 DIAGNOSIS — Z8261 Family history of arthritis: Secondary | ICD-10-CM | POA: Insufficient documentation

## 2016-07-07 DIAGNOSIS — Z79899 Other long term (current) drug therapy: Secondary | ICD-10-CM | POA: Insufficient documentation

## 2016-07-07 DIAGNOSIS — M21071 Valgus deformity, not elsewhere classified, right ankle: Secondary | ICD-10-CM | POA: Diagnosis not present

## 2016-07-07 DIAGNOSIS — Z8489 Family history of other specified conditions: Secondary | ICD-10-CM | POA: Diagnosis not present

## 2016-07-07 DIAGNOSIS — E669 Obesity, unspecified: Secondary | ICD-10-CM | POA: Diagnosis not present

## 2016-07-07 DIAGNOSIS — Z8249 Family history of ischemic heart disease and other diseases of the circulatory system: Secondary | ICD-10-CM | POA: Diagnosis not present

## 2016-07-07 DIAGNOSIS — M76821 Posterior tibial tendinitis, right leg: Secondary | ICD-10-CM | POA: Diagnosis not present

## 2016-07-07 DIAGNOSIS — Z419 Encounter for procedure for purposes other than remedying health state, unspecified: Secondary | ICD-10-CM

## 2016-07-07 DIAGNOSIS — Q660 Congenital talipes equinovarus: Secondary | ICD-10-CM | POA: Diagnosis not present

## 2016-07-07 HISTORY — PX: FLAT FOOT RECONSTRUCTION-TAL GASTROC RECESSION: SHX6620

## 2016-07-07 HISTORY — PX: ANKLE FUSION: SHX881

## 2016-07-07 HISTORY — PX: SYNOVECTOMY: SHX5180

## 2016-07-07 HISTORY — PX: TRIPLE SUBTALAR FUSION: SHX6633

## 2016-07-07 SURGERY — FLAT FOOT RECONSTRUCTION-TAL GASTROC RECESSION
Anesthesia: General | Laterality: Right

## 2016-07-07 MED ORDER — PROMETHAZINE HCL 25 MG/ML IJ SOLN: 6.2500 mg | INTRAMUSCULAR | Status: DC | PRN

## 2016-07-07 MED ORDER — FENTANYL CITRATE (PF) 100 MCG/2ML IJ SOLN
INTRAMUSCULAR | Status: DC | PRN
  Administered 2016-07-07 (×4): 50 ug via INTRAVENOUS

## 2016-07-07 MED ORDER — FENTANYL CITRATE (PF) 100 MCG/2ML IJ SOLN
INTRAMUSCULAR | Status: DC
Start: 2016-07-07 — End: 2016-07-07
  Filled 2016-07-07: qty 2

## 2016-07-07 MED ORDER — DEXAMETHASONE SODIUM PHOSPHATE 10 MG/ML IJ SOLN
INTRAMUSCULAR | Status: DC | PRN
  Administered 2016-07-07: 8 mg via INTRAVENOUS

## 2016-07-07 MED ORDER — EPHEDRINE SULFATE 50 MG/ML IJ SOLN
INTRAMUSCULAR | Status: DC | PRN
  Administered 2016-07-07: 10 mg via INTRAVENOUS

## 2016-07-07 MED ORDER — BUPIVACAINE HCL (PF) 0.25 % IJ SOLN
INTRAMUSCULAR | Status: AC
  Filled 2016-07-07: qty 30

## 2016-07-07 MED ORDER — BUPIVACAINE HCL (PF) 0.25 % IJ SOLN
INTRAMUSCULAR | Status: DC | PRN
  Administered 2016-07-07: 20 mL

## 2016-07-07 MED ORDER — NEOSTIGMINE METHYLSULFATE 10 MG/10ML IV SOLN
INTRAVENOUS | Status: DC | PRN
  Administered 2016-07-07: 4 mg via INTRAVENOUS

## 2016-07-07 MED ORDER — ONDANSETRON HCL 4 MG/2ML IJ SOLN
INTRAMUSCULAR | Status: DC | PRN
Start: 2016-07-07 — End: 2016-07-07
  Administered 2016-07-07: 4 mg via INTRAVENOUS

## 2016-07-07 MED ORDER — BUPIVACAINE HCL (PF) 0.5 % IJ SOLN
INTRAMUSCULAR | Status: AC
  Filled 2016-07-07: qty 30

## 2016-07-07 MED ORDER — LIDOCAINE-EPINEPHRINE 1 %-1:100000 IJ SOLN
INTRAMUSCULAR | Status: DC | PRN
Start: 2016-07-07 — End: 2016-07-07
  Administered 2016-07-07: 7 mL

## 2016-07-07 MED ORDER — PHENYLEPHRINE HCL 10 MG/ML IJ SOLN
INTRAMUSCULAR | Status: DC | PRN
  Administered 2016-07-07: 50 ug via INTRAVENOUS

## 2016-07-07 MED ORDER — FENTANYL CITRATE (PF) 100 MCG/2ML IJ SOLN
50.0000 ug | Freq: Once | INTRAMUSCULAR | Status: AC
  Administered 2016-07-07: 50 ug via INTRAVENOUS

## 2016-07-07 MED ORDER — PROPOFOL 10 MG/ML IV BOLUS
INTRAVENOUS | Status: DC | PRN
  Administered 2016-07-07: 180 mg via INTRAVENOUS
  Administered 2016-07-07 (×2): 10 mg via INTRAVENOUS

## 2016-07-07 MED ORDER — FENTANYL CITRATE (PF) 100 MCG/2ML IJ SOLN
INTRAMUSCULAR | Status: AC
  Filled 2016-07-07: qty 2

## 2016-07-07 MED ORDER — OXYCODONE-ACETAMINOPHEN 7.5-325 MG PO TABS
ORAL_TABLET | ORAL | Status: AC
  Administered 2016-07-07: 2 via ORAL
  Filled 2016-07-07: qty 2

## 2016-07-07 MED ORDER — CEFAZOLIN SODIUM-DEXTROSE 2-4 GM/100ML-% IV SOLN
INTRAVENOUS | Status: AC
  Administered 2016-07-07: 2 g via INTRAVENOUS
  Filled 2016-07-07: qty 100

## 2016-07-07 MED ORDER — LIDOCAINE-EPINEPHRINE 1 %-1:100000 IJ SOLN
INTRAMUSCULAR | Status: AC
  Filled 2016-07-07: qty 1

## 2016-07-07 MED ORDER — OXYCODONE-ACETAMINOPHEN 7.5-325 MG PO TABS: ORAL_TABLET | ORAL | 0 refills | Status: DC

## 2016-07-07 MED ORDER — HYDROMORPHONE HCL 1 MG/ML IJ SOLN
0.5000 mg | INTRAMUSCULAR | Status: AC | PRN
  Administered 2016-07-07 (×4): 0.5 mg via INTRAVENOUS

## 2016-07-07 MED ORDER — FENTANYL CITRATE (PF) 100 MCG/2ML IJ SOLN
25.0000 ug | INTRAMUSCULAR | Status: DC | PRN
  Administered 2016-07-07 (×3): 50 ug via INTRAVENOUS

## 2016-07-07 MED ORDER — OXYCODONE-ACETAMINOPHEN 7.5-325 MG PO TABS
2.0000 | ORAL_TABLET | ORAL | Status: DC | PRN
  Administered 2016-07-07: 2 via ORAL

## 2016-07-07 MED ORDER — CEFAZOLIN SODIUM-DEXTROSE 2-3 GM-% IV SOLR
INTRAVENOUS | Status: DC | PRN
  Administered 2016-07-07: 2 g via INTRAVENOUS

## 2016-07-07 MED ORDER — SUCCINYLCHOLINE CHLORIDE 20 MG/ML IJ SOLN
INTRAMUSCULAR | Status: DC | PRN
  Administered 2016-07-07: 120 mg via INTRAVENOUS

## 2016-07-07 MED ORDER — CEFAZOLIN SODIUM-DEXTROSE 2-4 GM/100ML-% IV SOLN
2.0000 g | Freq: Once | INTRAVENOUS | Status: AC
  Administered 2016-07-07: 2 g via INTRAVENOUS

## 2016-07-07 MED ORDER — LIDOCAINE HCL (PF) 1 % IJ SOLN
INTRAMUSCULAR | Status: AC
  Filled 2016-07-07: qty 30

## 2016-07-07 MED ORDER — HYDROMORPHONE HCL 1 MG/ML IJ SOLN
INTRAMUSCULAR | Status: AC
  Filled 2016-07-07: qty 1

## 2016-07-07 MED ORDER — ROPIVACAINE HCL 5 MG/ML IJ SOLN
INTRAMUSCULAR | Status: AC
  Filled 2016-07-07: qty 40

## 2016-07-07 MED ORDER — GLYCOPYRROLATE 0.2 MG/ML IJ SOLN
INTRAMUSCULAR | Status: DC | PRN
  Administered 2016-07-07: .6 mg via INTRAVENOUS

## 2016-07-07 MED ORDER — FAMOTIDINE 20 MG PO TABS
ORAL_TABLET | ORAL | Status: AC
  Administered 2016-07-07: 20 mg via ORAL
  Filled 2016-07-07: qty 1

## 2016-07-07 MED ORDER — FAMOTIDINE 20 MG PO TABS
20.0000 mg | ORAL_TABLET | Freq: Once | ORAL | Status: AC
  Administered 2016-07-07: 20 mg via ORAL

## 2016-07-07 MED ORDER — ROPIVACAINE HCL 5 MG/ML IJ SOLN
INTRAMUSCULAR | Status: DC | PRN
  Administered 2016-07-07: 30 mL via PERINEURAL

## 2016-07-07 MED ORDER — MIDAZOLAM HCL 2 MG/2ML IJ SOLN
INTRAMUSCULAR | Status: DC | PRN
  Administered 2016-07-07 (×2): 1 mg via INTRAVENOUS

## 2016-07-07 MED ORDER — LACTATED RINGERS IV SOLN
INTRAVENOUS | Status: DC
  Administered 2016-07-07: 50 mL/h via INTRAVENOUS
  Administered 2016-07-07: 09:00:00 via INTRAVENOUS

## 2016-07-07 MED ORDER — ROCURONIUM BROMIDE 100 MG/10ML IV SOLN
INTRAVENOUS | Status: DC | PRN
  Administered 2016-07-07: 5 mg via INTRAVENOUS
  Administered 2016-07-07: 30 mg via INTRAVENOUS
  Administered 2016-07-07: 10 mg via INTRAVENOUS
  Administered 2016-07-07: 5 mg via INTRAVENOUS

## 2016-07-07 SURGICAL SUPPLY — 98 items
BAG COUNTER SPONGE EZ (MISCELLANEOUS) IMPLANT
BANDAGE ELASTIC 4 LF NS (GAUZE/BANDAGES/DRESSINGS) ×6 IMPLANT
BANDAGE STRETCH 3X4.1 STRL (GAUZE/BANDAGES/DRESSINGS) ×3 IMPLANT
BIT DRILL 1.5 (BIT) ×2
BIT DRILL 1.5MM (BIT) ×2 IMPLANT
BIT DRILL 2 FAST STEP (BIT) ×6 IMPLANT
BIT DRILL 4.8X300 (BIT) ×3 IMPLANT
BIT DRILL CALIBRATED 2.7 (BIT) ×3 IMPLANT
BLADE MED AGGRESSIVE (BLADE) IMPLANT
BLADE OSCILLATING/SAGITTAL (BLADE)
BLADE SURG 15 STRL LF DISP TIS (BLADE) ×8 IMPLANT
BLADE SURG 15 STRL SS (BLADE) ×4
BLADE SURG MINI STRL (BLADE) ×3 IMPLANT
BLADE SW THK.38XMED LNG THN (BLADE) IMPLANT
BNDG COHESIVE 4X5 TAN STRL (GAUZE/BANDAGES/DRESSINGS) ×3 IMPLANT
BNDG ESMARK 4X12 TAN STRL LF (GAUZE/BANDAGES/DRESSINGS) ×3 IMPLANT
BNDG GAUZE 4.5X4.1 6PLY STRL (MISCELLANEOUS) ×3 IMPLANT
BUR 4X45 EGG (BURR) ×3 IMPLANT
BUR 4X55 1 (BURR) IMPLANT
CANISTER SUCT 1200ML W/VALVE (MISCELLANEOUS) ×3 IMPLANT
CATH TRAY METER 16FR LF (MISCELLANEOUS) IMPLANT
DRAPE C-ARM XRAY 36X54 (DRAPES) ×3 IMPLANT
DRAPE C-ARMOR (DRAPES) ×3 IMPLANT
DRAPE FLUOR MINI C-ARM 54X84 (DRAPES) IMPLANT
DRAPE SURG 17X11 SM STRL (DRAPES) ×3 IMPLANT
DRESSING TELFA 4X3 1S ST N-ADH (GAUZE/BANDAGES/DRESSINGS) ×3 IMPLANT
DRILL BIT 1.5MM (BIT) ×1
DRSG TEGADERM 4X4.75 (GAUZE/BANDAGES/DRESSINGS) IMPLANT
DURAPREP 26ML APPLICATOR (WOUND CARE) ×3 IMPLANT
ELECT REM PT RETURN 9FT ADLT (ELECTROSURGICAL) ×3
ELECTRODE REM PT RTRN 9FT ADLT (ELECTROSURGICAL) ×2 IMPLANT
GAUZE PETRO XEROFOAM 1X8 (MISCELLANEOUS) ×6 IMPLANT
GAUZE SPONGE 4X4 12PLY STRL (GAUZE/BANDAGES/DRESSINGS) ×3 IMPLANT
GAUZE STRETCH 2X75IN STRL (MISCELLANEOUS) ×3 IMPLANT
GLOVE BIO SURGEON STRL SZ7.5 (GLOVE) IMPLANT
GLOVE INDICATOR 8.0 STRL GRN (GLOVE) IMPLANT
GLOVE SKINSENSE NS SZ7.5 (GLOVE) ×2
GLOVE SKINSENSE STRL SZ7.5 (GLOVE) ×4 IMPLANT
GLOVE SURG SYN 7.0 (GLOVE) ×3 IMPLANT
GOWN STRL REUS W/ TWL LRG LVL3 (GOWN DISPOSABLE) ×6 IMPLANT
GOWN STRL REUS W/TWL LRG LVL3 (GOWN DISPOSABLE) ×3
GRADUATE 1200CC STRL 31836 (MISCELLANEOUS) ×3 IMPLANT
GRAFT TRINITY ELITE LGE HUMAN (Tissue) ×3 IMPLANT
K-WIRE ACE 1.6X6 (WIRE) ×24
KIT RM TURNOVER STRD PROC AR (KITS) ×3 IMPLANT
KIT SHOULDER TRACTION (DRAPES) IMPLANT
KWIRE ACE 1.6X6 (WIRE) ×16 IMPLANT
LABEL OR SOLS (LABEL) IMPLANT
MANIPULATOR VCARE STD CRV RETR (MISCELLANEOUS) IMPLANT
NDL SAFETY 18GX1.5 (NEEDLE) ×3 IMPLANT
NEEDLE FILTER BLUNT 18X 1/2SAF (NEEDLE) ×1
NEEDLE FILTER BLUNT 18X1 1/2 (NEEDLE) ×2 IMPLANT
NEEDLE HYPO 25X1 1.5 SAFETY (NEEDLE) ×9 IMPLANT
NS IRRIG 500ML POUR BTL (IV SOLUTION) ×3 IMPLANT
PACK EXTREMITY ARMC (MISCELLANEOUS) ×3 IMPLANT
PAD ABD DERMACEA PRESS 5X9 (GAUZE/BANDAGES/DRESSINGS) ×3 IMPLANT
PAD PREP 24X41 OB/GYN DISP (PERSONAL CARE ITEMS) ×3 IMPLANT
PADDING CAST BLEND 4X4 NS (MISCELLANEOUS) ×3 IMPLANT
PENCIL ELECTRO HAND CTR (MISCELLANEOUS) IMPLANT
PIN GUIDE DRILL TIP 2.8X300 (DRILL) ×6 IMPLANT
PLATE 2.5 SM F/IN-LINE FUSION (Plate) ×6 IMPLANT
RASP SM TEAR CROSS CUT (RASP) ×3 IMPLANT
SCREW CANN THRD 6.5X70 (Screw) ×3 IMPLANT
SCREW CANNULATED 6.5X65 KNEE (Screw) ×3 IMPLANT
SCREW PEG 2.5X16 NONLOCK (Screw) ×3 IMPLANT
SCREW PEG 2.5X20 NONLOCK (Screw) ×3 IMPLANT
SCREW PEG LOCK 2.5X16 (Peg) ×9 IMPLANT
SCREW PEG LOCK 2.5X18 (Peg) ×3 IMPLANT
SCREW PEG LOCK 2.5X20 (Peg) ×6 IMPLANT
SPLINT CAST 1 STEP 4X30 (MISCELLANEOUS) ×3 IMPLANT
SPLINT FAST PLASTER 5X30 (CAST SUPPLIES) ×1
SPLINT PLASTER CAST FAST 5X30 (CAST SUPPLIES) ×2 IMPLANT
SPONGE LAP 18X18 5 PK (GAUZE/BANDAGES/DRESSINGS) ×3 IMPLANT
STAPLE NIT SUPER 20X20X20X2 (Staple) ×3 IMPLANT
STAPLER SKIN PROX 35W (STAPLE) ×3 IMPLANT
STOCKINETTE M/LG 89821 (MISCELLANEOUS) ×3 IMPLANT
STOCKINETTE STRL 6IN 960660 (GAUZE/BANDAGES/DRESSINGS) ×3 IMPLANT
STRAP SAFETY BODY (MISCELLANEOUS) ×3 IMPLANT
STRIP CLOSURE SKIN 1/4X4 (GAUZE/BANDAGES/DRESSINGS) IMPLANT
SUT ETHILON 3-0 (SUTURE) IMPLANT
SUT ETHILON 5-0 FS-2 18 BLK (SUTURE) ×3 IMPLANT
SUT ETHILON NAB PS2 4-0 18IN (SUTURE) IMPLANT
SUT MNCRL+ 5-0 UNDYED PC-3 (SUTURE) ×2 IMPLANT
SUT MONOCRYL 5-0 (SUTURE) ×1
SUT VIC AB 2-0 CT1 27 (SUTURE)
SUT VIC AB 2-0 CT1 TAPERPNT 27 (SUTURE) IMPLANT
SUT VIC AB 2-0 CT2 27 (SUTURE) ×3 IMPLANT
SUT VIC AB 3-0 SH 27 (SUTURE) ×1
SUT VIC AB 3-0 SH 27X BRD (SUTURE) ×2 IMPLANT
SUT VIC AB 4-0 FS2 27 (SUTURE) ×3 IMPLANT
SUT VIC AB 4-0 PS2 18 (SUTURE) ×3 IMPLANT
SUT VICRYL AB 3-0 FS1 BRD 27IN (SUTURE) ×3 IMPLANT
SYR 3ML LL SCALE MARK (SYRINGE) ×3 IMPLANT
SYRINGE 10CC LL (SYRINGE) ×6 IMPLANT
WAND TENDON TOPAZ 0 ANGL (MISCELLANEOUS) ×3 IMPLANT
WIRE MAGNUM (SUTURE) IMPLANT
WIRE Z .045 C-WIRE SPADE TIP (WIRE) IMPLANT
WIRE Z .062 C-WIRE SPADE TIP (WIRE) IMPLANT

## 2016-07-07 NOTE — Transfer of Care (Signed)
Immediate Anesthesia Transfer of Care Note  Patient: Melissa Sparks  Procedure(s) Performed: Procedure(s): FLAT FOOT RECONSTRUCTION-TAL GASTROC RECESSION (Right) ARTHRODESIS ANKLE (Right) TRIPLE SUBTALAR FUSION (Right)  Patient Location: PACU  Anesthesia Type:General  Level of Consciousness: sedated  Airway & Oxygen Therapy: Patient Spontanous Breathing and Patient connected to face mask oxygen  Post-op Assessment: Report given to RN and Post -op Vital signs reviewed and stable  Post vital signs: Reviewed and stable  Last Vitals:  Vitals:   07/07/16 0828 07/07/16 1308  BP: 127/66 (!) 122/93  Pulse: 70 76  Resp: 16 19  Temp: 36.7 C 36.2 C    Last Pain:  Vitals:   07/07/16 1308  TempSrc:   PainSc: Asleep      Patients Stated Pain Goal: 0 ([id number])  Complications: No apparent anesthesia complications

## 2016-07-07 NOTE — H&P (Signed)
  HISTORY AND PHYSICAL INTERVAL NOTE:  07/07/2016  8:31 AM  Melissa Sparks  has presented today for surgery, with the diagnosis of right tibalis tendonitis.  The various methods of treatment have been discussed with the patient.  No guarantees were given.  After consideration of risks, benefits and other options for treatment, the patient has consented to surgery.  I have reviewed the patients' chart and labs.    Patient Vitals for the past 24 hrs:  BP Temp Temp src Pulse Resp SpO2 Height Weight  07/07/16 0828 127/66 98.1 F (36.7 C) Oral 70 16 100 % - -  07/07/16 0820 - - - - - - 5\' 7"  (1.702 m) 122.5 kg (270 lb)    A history and physical examination was performed in my office.  The patient was reexamined.  There have been no changes to this history and physical examination.  Samara Deist A

## 2016-07-07 NOTE — Discharge Instructions (Signed)
AMBULATORY SURGERY  DISCHARGE INSTRUCTIONS   1) The drugs that you were given will stay in your system until tomorrow so for the next 24 hours you should not:  A) Drive an automobile B) Make any legal decisions C) Drink any alcoholic beverage   2) You may resume regular meals tomorrow.  Today it is better to start with liquids and gradually work up to solid foods.  You may eat anything you prefer, but it is better to start with liquids, then soup and crackers, and gradually work up to solid foods.   3) Please notify your doctor immediately if you have any unusual bleeding, trouble breathing, redness and pain at the surgery site, drainage, fever, or pain not relieved by medication.    4) Additional Instructions:        Please contact your physician with any problems or Same Day Surgery at [phone], Monday through Friday 6 am to 4 pm, or [place] [facility] number at [phone].  POST OPERATIVE INSTRUCTIONS FOR DR. Roland   1. Take your medication as prescribed.  Pain medication should be taken only as needed.  2. Keep the dressing clean, dry and intact.  3. Keep your foot elevated above the heart level for the first 48 hours.  4. Walking to the bathroom and brief periods of walking are acceptable, unless we have instructed you to be non-weight bearing.  5. Always wear your post-op shoe when walking.  Always use your crutches if you are to be non-weight bearing.  6. Do not take a shower. Baths are permissible as long as the foot and leg is kept out of the water.   7. Every hour you are awake:  - Bend your knee 15 times.   8. Call [facility] [phone]) if any of the following problems occur: - You develop a temperature or fever. - The bandage becomes saturated with blood. - Medication does not stop your pain. - Injury of the foot occurs. - Any symptoms of infection including redness, odor,  or red streaks running from wound.

## 2016-07-07 NOTE — Op Note (Signed)
Operative note   Surgeon:Naziya Hegwood    Assistant:Matt Troxler    Preop diagnosis:1.  Gastroc equinus right lower extremity  2. Calcaneal valgus right lower leg  3. Posterior tibial tendinitis    Postop diagnosis: Same    Procedure:1. Rica Mote gastroc recession right lower leg  2.  Posterior tibial tendon tenosynovectomy and repair  3. Talonavicular joint fusion  4. Subtalar joint fusion all right lower leg and foot    EBL: Less than 25 ML's    Anesthesia:regional and general    Hemostasis: Thigh tourniquet inflated to 300 mmHg for 120 minutes    Specimen: None    Complications: None    Operative indications:Melissa Sparks is an 50 y.o. that presents today for surgical intervention.  The risks/benefits/alternatives/complications have been discussed and consent has been given.    Procedure:  Patient was brought into the OR and placed on the operating table in thesupine position. After anesthesia was obtained theright lower extremity was prepped and draped in usual sterile fashion.  Initially attention was directed to the posterior aspect of the calf where a longitudinal incision was made along the gastroc soleal junction. Sharp and blunt dissection carried down to the peritenon. The small saphenous and sural nerve were retracted. The peritenon was incised and the myotendinous junction was noted. At this time a Strayer gastroc soleal recession was placed from medial to lateral. Good excursion of the foot was noted with dorsiflexion. The wound was flushed with copious amounts or irrigation. Peritenon was closed with 3-0 Vicryl. Subcutaneous tissue was closed with 3-0 Vicryl and the skin closed with 4-0 nylon.  Inflation of the tourniquet was then performed. Attention was directed to the medial aspect of the posterior tibial tendon where a longitudinal incision was made from the medial malleolus to its navicular insertion. Sharp and blunt dissection were carried down to the tendon  sheath. The tendon sheath was then opened and there was noted to be marketed scarring with thickening and longitudinal tearing of the posterior tibial tendon. A large portion of the fibrotic tendon was removed from the surgical field. A longitudinal repair with 3-0 Vicryl was performed along the length. This was then infiltrated with a Topaz wand. Closure of the peritenon was then performed with 3-0 Vicryl as well as the subcutaneous tissue. The skin was closed with skin staples. Attention was then directed to the lateral aspect of the foot over the sinus tarsi subtalar joint region. A curvilinear incision was made from the distal tip of the fibula overlying the fourth and fifth met cuboid region. Sharp and blunt dissection carried down to the deeper fascial layers. The EDB muscle belly was then removed from the sinus tarsi and reflected distally. Next the subtalar joint was fully exposed at this time. All articular cartilage was then removed with the use of curettes a small bur. The anterior middle and posterior subtalar joints were removed down to good subchondral bone plates. After removal of the cartilaginous material. The area was then burred with a power bur and drilled with a 1.5 mm drill bit.   Attention was then directed along the anterior aspect of the right foot along the anterior tibial tendon. A longitudinal incision was made just overlying the anterior tibial tendon. Sharp and blunt dissection carried down to the deeper fascial layers. The anterior tibial tendon was reflected medially and the extensor hallucis longus tendon reflected laterally. Next a subperiosteal dissection was taken along the talonavicular joint from the ankle joint to the navicular  cuneiform joint. The head of the talus and navicular base were then exposed and all articular cartilage was removed with curettage. This was taken down to the subchondral bone plate. This was then burred with a power bur and drilled with a 1.5 mm drill  bit. This was taken down to good healthy bleeding raw bone.   Both the talonavicular joint and subtalar joints were then packed with Symphony Elite bone graft packing material. Full coverage was noted at this time. The subtalar joint fusion was then performed at this time. An initial guidewire was placed in the posterior aspect of the calcaneus crossing the subtalar joint. Good alignment was noted on the axial and lateral view. A 6.5 mm x 70 mm compression screw was then placed across the subtalar joint with excellent compression. The calcaneus was held in a neutral position. A second 6.5 mm screw was then placed just lateral to the initial screw and crossing the subtalar joint in a good position. Excellent compression was noted at this time.   The talonavicular joint fusion was then performed. Initially a compression staple was placed directly dorsal along the mid portion of the talonavicular joint. 2 small 2.5 mm locking plates from the Biomet screw set were placed medial and lateral to this region using standard technique and alignment and compression and stability was noted to the joints. Final fluoroscopy views were then performed without signs of issues. All wounds were then closed with a combination of 2-0 Vicryl 3-0 Vicryl and skin staples. 5% Marcaine was placed around all incision sites. She was placed in a well compressive sterile dressing and a posterior splint.    Patient tolerated the procedure and anesthesia well.  Was transported from the OR to the PACU with all vital signs stable and vascular status intact. To be discharged per routine protocol.  Will follow up in approximately 1 week in the outpatient clinic. A prescription for 7.5 mm Percocet was dispensed today.

## 2016-07-07 NOTE — Anesthesia Procedure Notes (Addendum)
Procedure Name: Intubation Date/Time: 07/07/2016 9:15 AM Performed by: Allean Found Pre-anesthesia Checklist: Patient identified, Emergency Drugs available, Suction available, Patient being monitored and Timeout performed Patient Re-evaluated:Patient Re-evaluated prior to inductionOxygen Delivery Method: Circle system utilized Preoxygenation: Pre-oxygenation with 100% oxygen Intubation Type: IV induction Laryngoscope Size: Mac and 3 Tube type: Oral Tube size: 7.0 mm Number of attempts: 2 Airway Equipment and Method: Stylet Placement Confirmation: ETT inserted through vocal cords under direct vision Secured at: 22 cm Dental Injury: Teeth and Oropharynx as per pre-operative assessment  Comments: DL #1 esophageal, DL #2 ET

## 2016-07-07 NOTE — Anesthesia Procedure Notes (Signed)
Anesthesia Regional Block:  Popliteal block  Pre-Anesthetic Checklist: ,, timeout performed, Correct Patient, Correct Site, Correct Laterality, Correct Procedure, Correct Position, site marked, Risks and benefits discussed,  Surgical consent,  Pre-op evaluation,  At surgeon's request and post-op pain management  Laterality: Right and Lower  Prep: chloraprep       Needles:  Injection technique: Single-shot  Needle Type: Echogenic Stimulator Needle     Needle Length: 13cm 13 cm Needle Gauge: 22 and 22 G    Additional Needles:  Procedures: ultrasound guided (picture in chart) Popliteal block Narrative:  Start time: 07/07/2016 9:16 AM End time: 07/07/2016 9:19 AM Injection made incrementally with aspirations every 5 mL.  Performed by: Personally  Anesthesiologist: Martha Clan

## 2016-07-07 NOTE — Anesthesia Preprocedure Evaluation (Signed)
Anesthesia Evaluation  Patient identified by MRN, date of birth, ID band Patient awake    Reviewed: Allergy & Precautions, H&P , NPO status , Patient's Chart, lab work & pertinent test results, reviewed documented beta blocker date and time   Airway Mallampati: III  TM Distance: >3 FB Neck ROM: full    Dental no notable dental hx. (+) Teeth Intact   Pulmonary neg shortness of breath, neg sleep apnea, neg COPD, neg recent URI, former smoker,    Pulmonary exam normal breath sounds clear to auscultation       Cardiovascular Exercise Tolerance: Good hypertension, Normal cardiovascular exam Rhythm:regular Rate:Normal     Neuro/Psych negative neurological ROS  negative psych ROS   GI/Hepatic negative GI ROS, Neg liver ROS,   Endo/Other  neg diabetesMorbid obesity  Renal/GU negative Renal ROS  negative genitourinary   Musculoskeletal   Abdominal   Peds  Hematology negative hematology ROS (+)   Anesthesia Other Findings Past Medical History:   Hypertension                                                 Anemia                                                       Signs and symptoms suggestive of sleep apnea    Reproductive/Obstetrics negative OB ROS                             Anesthesia Physical  Anesthesia Plan  ASA: III  Anesthesia Plan: General   Post-op Pain Management: GA combined w/ Regional for post-op pain   Induction:   Airway Management Planned:   Additional Equipment:   Intra-op Plan:   Post-operative Plan:   Informed Consent: I have reviewed the patients History and Physical, chart, labs and discussed the procedure including the risks, benefits and alternatives for the proposed anesthesia with the patient or authorized representative who has indicated his/her understanding and acceptance.   Dental Advisory Given  Plan Discussed with: Anesthesiologist, CRNA and  Surgeon  Anesthesia Plan Comments:         Anesthesia Quick Evaluation

## 2016-07-08 NOTE — Anesthesia Postprocedure Evaluation (Signed)
Anesthesia Post Note  Patient: Melissa Sparks  Procedure(s) Performed: Procedure(s) (LRB): FLAT FOOT RECONSTRUCTION-TAL GASTROC RECESSION (Right) ARTHRODESIS ANKLE (Right) TRIPLE SUBTALAR FUSION (Right) Posterior tibial tendon tenosynovectomy  Patient location during evaluation: PACU Anesthesia Type: General Level of consciousness: awake and alert Pain management: pain level controlled Vital Signs Assessment: post-procedure vital signs reviewed and stable Respiratory status: spontaneous breathing, nonlabored ventilation, respiratory function stable and patient connected to nasal cannula oxygen Cardiovascular status: blood pressure returned to baseline and stable Postop Assessment: no signs of nausea or vomiting Anesthetic complications: no    Last Vitals:  Vitals:   07/07/16 1509 07/07/16 1555  BP: 115/81 (P) 126/60  Pulse: 84 (P) 85  Resp: 14 (P) 16  Temp: 36.4 C (P) 36.6 C    Last Pain:  Vitals:   07/07/16 1555  TempSrc:   PainSc: (P) 6                  Martha Clan

## 2016-07-10 ENCOUNTER — Encounter: Payer: Self-pay | Admitting: Podiatry

## 2016-07-28 ENCOUNTER — Encounter: Payer: Self-pay | Admitting: Podiatry

## 2016-10-18 ENCOUNTER — Other Ambulatory Visit: Payer: Self-pay | Admitting: Internal Medicine

## 2016-10-18 DIAGNOSIS — Z1231 Encounter for screening mammogram for malignant neoplasm of breast: Secondary | ICD-10-CM

## 2016-11-28 ENCOUNTER — Ambulatory Visit: Payer: BLUE CROSS/BLUE SHIELD | Attending: Internal Medicine

## 2017-04-10 ENCOUNTER — Other Ambulatory Visit: Payer: Self-pay | Admitting: Internal Medicine

## 2017-04-10 DIAGNOSIS — R102 Pelvic and perineal pain: Secondary | ICD-10-CM

## 2017-04-16 ENCOUNTER — Ambulatory Visit
Admission: RE | Admit: 2017-04-16 | Discharge: 2017-04-16 | Disposition: A | Discharge status: Home / Self Care | Payer: BLUE CROSS/BLUE SHIELD | Source: Ambulatory Visit | Attending: Internal Medicine | Admitting: Internal Medicine

## 2017-04-16 DIAGNOSIS — Z90710 Acquired absence of both cervix and uterus: Secondary | ICD-10-CM | POA: Insufficient documentation

## 2017-04-16 DIAGNOSIS — R102 Pelvic and perineal pain: Secondary | ICD-10-CM | POA: Insufficient documentation

## 2017-04-18 ENCOUNTER — Encounter: Payer: Self-pay | Admitting: Emergency Medicine

## 2017-04-18 ENCOUNTER — Emergency Department
Admission: EM | Admit: 2017-04-18 | Discharge: 2017-04-18 | Disposition: A | Discharge status: Home / Self Care | Payer: BLUE CROSS/BLUE SHIELD | Attending: Emergency Medicine | Admitting: Emergency Medicine

## 2017-04-18 DIAGNOSIS — Z87891 Personal history of nicotine dependence: Secondary | ICD-10-CM | POA: Insufficient documentation

## 2017-04-18 DIAGNOSIS — I10 Essential (primary) hypertension: Secondary | ICD-10-CM | POA: Insufficient documentation

## 2017-04-18 DIAGNOSIS — Z79899 Other long term (current) drug therapy: Secondary | ICD-10-CM | POA: Diagnosis not present

## 2017-04-18 DIAGNOSIS — N8110 Cystocele, unspecified: Secondary | ICD-10-CM | POA: Diagnosis not present

## 2017-04-18 DIAGNOSIS — R102 Pelvic and perineal pain: Secondary | ICD-10-CM | POA: Diagnosis present

## 2017-04-18 LAB — URINALYSIS, COMPLETE (UACMP) WITH MICROSCOPIC
BILIRUBIN URINE: NEGATIVE
Glucose, UA: NEGATIVE mg/dL
Ketones, ur: NEGATIVE mg/dL
LEUKOCYTES UA: NEGATIVE
NITRITE: NEGATIVE
PH: 5 (ref 5.0–8.0)
Protein, ur: NEGATIVE mg/dL
SPECIFIC GRAVITY, URINE: 1.019 (ref 1.005–1.030)

## 2017-04-18 NOTE — Discharge Instructions (Signed)
If you have another prolapse, gently reduce it with your hand, if it stays out or becomes inflamed or infected or you have trouble with abdominal pain, bowel movements or vomiting please return to the emergency room. Follow up closely with your OB/GYN.

## 2017-04-18 NOTE — ED Provider Notes (Signed)
[facility] Emergency Department Provider Note  ____________________________________________   I have reviewed the triage vital signs and the nursing notes.   HISTORY  Chief Complaint Pelvic Pain    HPI Addeline L Simoni is a 51 y.o. female who presents today complaining of 1 month of feeling pressure in her pelvic region. She did have she believes to be a total hysterectomy she states 2 years ago. She states over the last month she's had pressure down there. She does have stress incontinence and has for years, and that has not changed. She states that she has no dysuria. Possible increased frequency of urination. She denies vaginal bleeding or pain at this time. She states over the last month this sensation of having something coming from her vagina is happy with more frequency. It happened this morning she was able to put it back in what ever it is. She has no pain or symptoms at this time. She's had normal bowel movements. No vomiting, no ongoing abdominal pain. She has not been able to see her OB/GYN yet although she apparently has called for and she did see her primary care doctor, had an ultrasound done on the 14th of this month which shows according to radiologist following   "Status post hysterectomy and bilateral salpingectomy  Cystic lesion with a single partial septation in the right adnexa as described. Patient gives history of prior salpingo oophorectomy although the ovaries were not mentioned in the pathology report. If patient is premenopausal, this is almost certainly benign, and no specific imaging follow up is recommended"     Past Medical History:  Diagnosis Date  . Anemia   . Hypertension     Patient Active Problem List   Diagnosis Date Noted  . S/P hysterectomy 07/06/2015    Past Surgical History:  Procedure Laterality Date  . ABDOMINAL HYSTERECTOMY    . ANKLE FUSION Right 07/07/2016   Procedure: ARTHRODESIS ANKLE;  Surgeon: Samara Deist, DPM;  Location: [facility];  Service: Podiatry;  Laterality: Right;  . FLAT FOOT RECONSTRUCTION-TAL GASTROC RECESSION Right 07/07/2016   Procedure: FLAT FOOT RECONSTRUCTION-TAL GASTROC RECESSION;  Surgeon: Samara Deist, DPM;  Location: [facility];  Service: Podiatry;  Laterality: Right;  . HYSTEROSCOPY W/D&C  06/11/2015   Procedure: DILATATION AND CURETTAGE /HYSTEROSCOPY;  Surgeon: Honor Loh Ward, MD;  Location: [facility];  Service: Gynecology;;  . LAPAROSCOPIC HYSTERECTOMY N/A 07/06/2015   Procedure: HYSTERECTOMY TOTAL LAPAROSCOPIC;  Surgeon: Honor Loh Ward, MD;  Location: [facility];  Service: Gynecology;  Laterality: N/A;  . LAPAROSCOPIC SALPINGO OOPHERECTOMY Bilateral 07/06/2015   Procedure: LAPAROSCOPIC SALPINGO OOPHORECTOMY;  Surgeon: Honor Loh Ward, MD;  Location: [facility];  Service: Gynecology;  Laterality: Bilateral;  . SYNOVECTOMY  07/07/2016   Procedure: Posterior tibial tendon tenosynovectomy;  Surgeon: Samara Deist, DPM;  Location: [facility];  Service: Podiatry;;  . TRIPLE SUBTALAR FUSION Right 07/07/2016   Procedure: TRIPLE SUBTALAR FUSION;  Surgeon: Samara Deist, DPM;  Location: [facility];  Service: Podiatry;  Laterality: Right;  . TUBAL LIGATION  1987    Prior to Admission medications   Medication Sig Start Date End Date Taking? Authorizing Provider  acetaminophen (TYLENOL) 325 MG tablet Take 2 tablets (650 mg total) by mouth every 4 (four) hours. 07/07/15   Ward, Honor Loh, MD  ibuprofen (ADVIL,MOTRIN) 800 MG tablet Take 1 tablet (800 mg total) by mouth every 8 (eight) hours as needed. 06/07/16   Daymon Larsen, MD  losartan-hydrochlorothiazide (HYZAAR) 100-25 MG per tablet Take 1  tablet by mouth daily.    [provider]  metoprolol (LOPRESSOR) 50 MG tablet Take 50 mg by mouth 2 (two) times daily.    [provider]  oxyCODONE-acetaminophen (PERCOCET) 7.5-325 MG tablet 1-2 Tablets q 4-6 hours prn pain 07/07/16   Samara Deist, DPM    Allergies Latex  No family  history on file.  Social History Social History  Substance Use Topics  . Smoking status: Former Smoker    Packs/day: 2.00    Years: 30.00    Types: Cigarettes    Quit date: 03/09/2015  . Smokeless tobacco: Never Used  . Alcohol use No    Review of Systems Constitutional: No fever/chills Eyes: No visual changes. ENT: No sore throat. No stiff neck no neck pain Cardiovascular: Denies chest pain. Respiratory: Denies shortness of breath. Gastrointestinal:   no vomiting.  No diarrhea.  No constipation. Genitourinary: Negative for dysuria. Musculoskeletal: Negative lower extremity swelling Skin: Negative for rash. Neurological: Negative for severe headaches, focal weakness or numbness. 10-point ROS otherwise negative.  ____________________________________________   PHYSICAL EXAM:  VITAL SIGNS: ED Triage Vitals  Enc Vitals Group     BP 04/18/17 1726 129/66     Pulse Rate 04/18/17 1726 69     Resp 04/18/17 1726 18     Temp 04/18/17 1726 98.6 F (37 C)     Temp Source 04/18/17 1726 Oral     SpO2 04/18/17 1726 100 %     Weight 04/18/17 1727 230 lb (104.3 kg)     Height 04/18/17 1727 5\' 7"  (1.702 m)     Head Circumference --      Peak Flow --      Pain Score 04/18/17 1726 0     Pain Loc --      Pain Edu? --      Excl. in [place]? --     Constitutional: Alert and oriented. Well appearing and in no acute distress. Eyes: Conjunctivae are normal. PERRL. EOMI. Head: Atraumatic. Nose: No congestion/rhinnorhea. Mouth/Throat: Mucous membranes are moist.  Oropharynx non-erythematous. Neck: No stridor.   Nontender with no meningismus Cardiovascular: Normal rate, regular rhythm. Grossly normal heart sounds.  Good peripheral circulation. Respiratory: Normal respiratory effort.  No retractions. Lungs CTAB. Abdominal: Soft and nontender. No distention. No guarding no rebound Back:  There is no focal tenderness or step off.  there is no midline tenderness there are no lesions noted. there  is no CVA tenderness  Musculoskeletal: No lower extremity tenderness, no upper extremity tenderness. No joint effusions, no DVT signs strong distal pulses no edema Neurologic:  Normal speech and language. No gross focal neurologic deficits are appreciated.  Skin:  Skin is warm, dry and intact. No rash noted. Psychiatric: Mood and affect are normal. Speech and behavior are normal.  ____________________________________________   LABS (all labs ordered are listed, but only abnormal results are displayed)  Labs Reviewed  URINALYSIS, COMPLETE (UACMP) WITH MICROSCOPIC - Abnormal; Notable for the following:       Result Value   Color, Urine YELLOW (*)    APPearance CLEAR (*)    Hgb urine dipstick MODERATE (*)    Bacteria, UA RARE (*)    Squamous Epithelial / LPF 0-5 (*)    All other components within normal limits   ____________________________________________  EKG  I personally interpreted any EKGs ordered by me or triage  ____________________________________________  RADIOLOGY  I reviewed any imaging ordered by me or triage that were performed during my shift and, if  possible, patient and/or family made aware of any abnormal findings. ____________________________________________   PROCEDURES  Procedure(s) performed: None  Procedures  Critical Care performed: None  ____________________________________________   INITIAL IMPRESSION / ASSESSMENT AND PLAN / ED COURSE  Pertinent labs & imaging results that were available during my care of the patient were reviewed by me and considered in my medical decision making (see chart for details).  Patient with gradually progressing pelvic pressure and a sensation of foreign body in her vagina over the last month and finally collating today in feeling something, at which she was able to "push back in". This certainly could represent some degree of prolapse although the patient is status post what appears to be a partial hysterectomy.  Enterovaginal communication is certainly also possible. We will perform a pelvic exam and discuss with OB.  ----------------------------------------- 6:41 PM on 04/18/2017 -----------------------------------------  Pelvic exam performed with female nurse chaperone, patient has vaginal wall up against the introitus. No evidence of intestinal herniation. Nontender, no discharge. Easily reducible soft, no masses palpated. Discussed with Dr. Ouida Sills, who feels record for patient to be discharged and closely followed up with Dr. Reita Cliche. Return questions upon given and understood.    ____________________________________________   FINAL CLINICAL IMPRESSION(S) / ED DIAGNOSES  Final diagnoses:  None      This chart was dictated using voice recognition software.  Despite best efforts to proofread,  errors can occur which can change meaning.      Schuyler Amor, MD 04/18/17 [phone]

## 2017-04-18 NOTE — ED Triage Notes (Signed)
Pt reports having pelvic pain this week and saw PCP. Pt reports today when showering she felt something soft and spongy protruding from her vagina. Pt reports increased urinary frequency but is not able to go much when she does. Denies vaginal bleeding.

## 2017-12-01 ENCOUNTER — Emergency Department: Payer: BLUE CROSS/BLUE SHIELD

## 2017-12-01 ENCOUNTER — Other Ambulatory Visit: Payer: Self-pay

## 2017-12-01 ENCOUNTER — Emergency Department
Admission: EM | Admit: 2017-12-01 | Discharge: 2017-12-01 | Disposition: A | Discharge status: Home / Self Care | Payer: BLUE CROSS/BLUE SHIELD | Attending: Emergency Medicine | Admitting: Emergency Medicine

## 2017-12-01 DIAGNOSIS — Z79899 Other long term (current) drug therapy: Secondary | ICD-10-CM | POA: Insufficient documentation

## 2017-12-01 DIAGNOSIS — R1011 Right upper quadrant pain: Secondary | ICD-10-CM | POA: Diagnosis present

## 2017-12-01 DIAGNOSIS — R1013 Epigastric pain: Secondary | ICD-10-CM

## 2017-12-01 DIAGNOSIS — Z87891 Personal history of nicotine dependence: Secondary | ICD-10-CM | POA: Insufficient documentation

## 2017-12-01 DIAGNOSIS — Z91040 Latex allergy status: Secondary | ICD-10-CM | POA: Diagnosis not present

## 2017-12-01 DIAGNOSIS — I10 Essential (primary) hypertension: Secondary | ICD-10-CM | POA: Insufficient documentation

## 2017-12-01 DIAGNOSIS — R109 Unspecified abdominal pain: Secondary | ICD-10-CM

## 2017-12-01 LAB — URINALYSIS, COMPLETE (UACMP) WITH MICROSCOPIC
BILIRUBIN URINE: NEGATIVE
Bacteria, UA: NONE SEEN
Glucose, UA: NEGATIVE mg/dL
Ketones, ur: NEGATIVE mg/dL
LEUKOCYTES UA: NEGATIVE
NITRITE: NEGATIVE
Protein, ur: NEGATIVE mg/dL
SPECIFIC GRAVITY, URINE: 1.005 (ref 1.005–1.030)
pH: 6 (ref 5.0–8.0)

## 2017-12-01 LAB — COMPREHENSIVE METABOLIC PANEL
ALK PHOS: 89 U/L (ref 38–126)
ALT: 15 U/L (ref 14–54)
ANION GAP: 8 (ref 5–15)
AST: 15 U/L (ref 15–41)
Albumin: 3.8 g/dL (ref 3.5–5.0)
BILIRUBIN TOTAL: 0.6 mg/dL (ref 0.3–1.2)
BUN: 20 mg/dL (ref 6–20)
CALCIUM: 9.1 mg/dL (ref 8.9–10.3)
CO2: 27 mmol/L (ref 22–32)
Chloride: 98 mmol/L — ABNORMAL LOW (ref 101–111)
Creatinine, Ser: 0.77 mg/dL (ref 0.44–1.00)
Glucose, Bld: 96 mg/dL (ref 65–99)
Potassium: 3.5 mmol/L (ref 3.5–5.1)
Sodium: 133 mmol/L — ABNORMAL LOW (ref 135–145)
TOTAL PROTEIN: 7.6 g/dL (ref 6.5–8.1)

## 2017-12-01 LAB — CBC
HCT: 39.6 % (ref 35.0–47.0)
HEMOGLOBIN: 13.4 g/dL (ref 12.0–16.0)
MCH: 29 pg (ref 26.0–34.0)
MCHC: 33.8 g/dL (ref 32.0–36.0)
MCV: 85.9 fL (ref 80.0–100.0)
Platelets: 229 10*3/uL (ref 150–440)
RBC: 4.62 MIL/uL (ref 3.80–5.20)
RDW: 14.2 % (ref 11.5–14.5)
WBC: 4.7 10*3/uL (ref 3.6–11.0)

## 2017-12-01 LAB — LIPASE, BLOOD: Lipase: 22 U/L (ref 11–51)

## 2017-12-01 MED ORDER — FAMOTIDINE 20 MG PO TABS: 20.0000 mg | ORAL_TABLET | Freq: Every day | ORAL | 0 refills | Status: DC

## 2017-12-01 NOTE — ED Notes (Signed)
Signature pad not working - pt signed for discharge and given rx for pepcid

## 2017-12-01 NOTE — ED Provider Notes (Addendum)
[facility] Emergency Department Provider Note  ____________________________________________   I have reviewed the triage vital signs and the nursing notes. Where available I have reviewed prior notes and, if possible and indicated, outside hospital notes.    HISTORY  Chief Complaint Abdominal Pain    HPI Melissa Sparks is a 51 y.o. female  Presents complaining of having off and on abdominal pain in the ruq for the last several weeks. Vomited x 1 today. No fever no chills normal bm no melena no brbpr no chest pain no sob.  Pain worse w food.     Location: Upper quadrant intermittent  Radiation: None Quality: Sharp Duration: A few weeks Timing: Worse with food Severity: Moderate at this time nearly gone Associated sxs: Vomited x1 today PriorTreatment : None   Past Medical History:  Diagnosis Date  . Anemia   . Hypertension     Patient Active Problem List   Diagnosis Date Noted  . S/P hysterectomy 07/06/2015    Past Surgical History:  Procedure Laterality Date  . ABDOMINAL HYSTERECTOMY    . ANKLE FUSION Right 07/07/2016   Procedure: ARTHRODESIS ANKLE;  Surgeon: Samara Deist, DPM;  Location: [facility];  Service: Podiatry;  Laterality: Right;  . FLAT FOOT RECONSTRUCTION-TAL GASTROC RECESSION Right 07/07/2016   Procedure: FLAT FOOT RECONSTRUCTION-TAL GASTROC RECESSION;  Surgeon: Samara Deist, DPM;  Location: [facility];  Service: Podiatry;  Laterality: Right;  . HYSTEROSCOPY W/D&C  06/11/2015   Procedure: DILATATION AND CURETTAGE /HYSTEROSCOPY;  Surgeon: Honor Loh Ward, MD;  Location: [facility];  Service: Gynecology;;  . LAPAROSCOPIC HYSTERECTOMY N/A 07/06/2015   Procedure: HYSTERECTOMY TOTAL LAPAROSCOPIC;  Surgeon: Honor Loh Ward, MD;  Location: [facility];  Service: Gynecology;  Laterality: N/A;  . LAPAROSCOPIC SALPINGO OOPHERECTOMY Bilateral 07/06/2015   Procedure: LAPAROSCOPIC SALPINGO OOPHORECTOMY;  Surgeon: Honor Loh Ward, MD;  Location: [facility];   Service: Gynecology;  Laterality: Bilateral;  . SYNOVECTOMY  07/07/2016   Procedure: Posterior tibial tendon tenosynovectomy;  Surgeon: Samara Deist, DPM;  Location: [facility];  Service: Podiatry;;  . TRIPLE SUBTALAR FUSION Right 07/07/2016   Procedure: TRIPLE SUBTALAR FUSION;  Surgeon: Samara Deist, DPM;  Location: [facility];  Service: Podiatry;  Laterality: Right;  . TUBAL LIGATION  1987    Prior to Admission medications   Medication Sig Start Date End Date Taking? Authorizing Provider  acetaminophen (TYLENOL) 325 MG tablet Take 2 tablets (650 mg total) by mouth every 4 (four) hours. 07/07/15   Ward, Honor Loh, MD  ibuprofen (ADVIL,MOTRIN) 800 MG tablet Take 1 tablet (800 mg total) by mouth every 8 (eight) hours as needed. 06/07/16   Daymon Larsen, MD  losartan-hydrochlorothiazide (HYZAAR) 100-25 MG per tablet Take 1 tablet by mouth daily.    [provider]  metoprolol (LOPRESSOR) 50 MG tablet Take 50 mg by mouth 2 (two) times daily.    [provider]  oxyCODONE-acetaminophen (PERCOCET) 7.5-325 MG tablet 1-2 Tablets q 4-6 hours prn pain 07/07/16   Samara Deist, DPM    Allergies Latex  No family history on file.  Social History Social History   Tobacco Use  . Smoking status: Former Smoker    Packs/day: 2.00    Years: 30.00    Pack years: 60.00    Types: Cigarettes    Last attempt to quit: 03/09/2015    Years since quitting: 2.7  . Smokeless tobacco: Never Used  Substance Use Topics  . Alcohol use: No  . Drug use: No  Review of Systems Constitutional: No fever/chills Eyes: No visual changes. ENT: No sore throat. No stiff neck no neck pain Cardiovascular: Denies chest pain. Respiratory: Denies shortness of breath. Gastrointestinal:   +vomiting.  No diarrhea.  No constipation. Genitourinary: Negative for dysuria. Musculoskeletal: Negative lower extremity swelling Skin: Negative for rash. Neurological: Negative for severe headaches, focal weakness or  numbness.   ____________________________________________   PHYSICAL EXAM:  VITAL SIGNS: ED Triage Vitals  Enc Vitals Group     BP 12/01/17 1332 118/61     Pulse Rate 12/01/17 1332 74     Resp 12/01/17 1332 16     Temp 12/01/17 1332 98.6 F (37 C)     Temp Source 12/01/17 1332 Oral     SpO2 12/01/17 1332 100 %     Weight 12/01/17 1333 240 lb (108.9 kg)     Height 12/01/17 1333 5\' 7"  (1.702 m)     Head Circumference --      Peak Flow --      Pain Score 12/01/17 1349 10     Pain Loc --      Pain Edu? --      Excl. in [place]? --     Constitutional: Alert and oriented. Well appearing and in no acute distress. Eyes: Conjunctivae are normal Head: Atraumatic HEENT: No congestion/rhinnorhea. Mucous membranes are moist.  Oropharynx non-erythematous Neck:   Nontender with no meningismus, no masses, no stridor Cardiovascular: Normal rate, regular rhythm. Grossly normal heart sounds.  Good peripheral circulation. Respiratory: Normal respiratory effort.  No retractions. Lungs CTAB. Abdominal: Soft and obese, positive right upper quadrant mild discomfort,. No distention. No guarding no rebound Back:  There is no focal tenderness or step off.  there is no midline tenderness there are no lesions noted. there is no CVA tenderness  Musculoskeletal: No lower extremity tenderness, no upper extremity tenderness. No joint effusions, no DVT signs strong distal pulses no edema Neurologic:  Normal speech and language. No gross focal neurologic deficits are appreciated.  Skin:  Skin is warm, dry and intact. No rash noted. Psychiatric: Mood and affect are normal. Speech and behavior are normal.  ____________________________________________   LABS (all labs ordered are listed, but only abnormal results are displayed)  Labs Reviewed  COMPREHENSIVE METABOLIC PANEL - Abnormal; Notable for the following components:      Result Value   Sodium 133 (*)    Chloride 98 (*)    All other components within  normal limits  URINALYSIS, COMPLETE (UACMP) WITH MICROSCOPIC - Abnormal; Notable for the following components:   Color, Urine STRAW (*)    APPearance CLEAR (*)    Hgb urine dipstick MODERATE (*)    Squamous Epithelial / LPF 6-30 (*)    All other components within normal limits  LIPASE, BLOOD  CBC    Pertinent labs  results that were available during my care of the patient were reviewed by me and considered in my medical decision making (see chart for details). ____________________________________________  EKG  I personally interpreted any EKGs ordered by me or triage  ____________________________________________  RADIOLOGY  Pertinent labs & imaging results that were available during my care of the patient were reviewed by me and considered in my medical decision making (see chart for details). If possible, patient and/or family made aware of any abnormal findings.  No results found. ____________________________________________    PROCEDURES  Procedure(s) performed: None  Procedures  Critical Care performed: None  ____________________________________________   INITIAL IMPRESSION / ASSESSMENT AND  PLAN / ED COURSE  Pertinent labs & imaging results that were available during my care of the patient were reviewed by me and considered in my medical decision making (see chart for details).  She with epigastric and right upper quadrant discomfort for several weeks, blood work is completely normal vital signs are normal exam is reassuring and differential includes distal esophagitis, reflux disease which is certainly not unlikely, or biliary colic.  The ultrasound is negative I feel couple sending the patient home with close outpatient follow-up with PCP and GI as needed patient very comfortable with this plan in no acute distress   ----------------------------------------- 6:52 PM on 12/01/2017 -----------------------------------------  Considering the patient's symptoms,  medical history, and physical examination today, I have low suspicion for cholecystitis or biliary pathology, pancreatitis, perforation or bowel obstruction, hernia, intra-abdominal abscess, AAA or dissection, volvulus or intussusception, mesenteric ischemia, ischemic gut, pyelonephritis or appendicitis, at this time, there does not appear to be clinical evidence to support the diagnosis of pulmonary embolus, dissection, myocarditis, endocarditis, pericarditis, pericardial tamponade, acute coronary syndrome, pneumothorax, pneumonia, or any other acute intrathoracic pathology that will require admission or acute intervention. Nor is there evidence of any significant intra-abdominal pathology causing this discomfort.   ____________________________________________   FINAL CLINICAL IMPRESSION(S) / ED DIAGNOSES  Final diagnoses:  Abdominal pain      This chart was dictated using voice recognition software.  Despite best efforts to proofread,  errors can occur which can change meaning.      Schuyler Amor, MD 12/01/17 1757    Schuyler Amor, MD 12/01/17 [phone]

## 2017-12-01 NOTE — ED Triage Notes (Signed)
Pt arrives to ED via POV with c/o generalized abdominal pain that started this morning PTA. Pt reports 1 episode of emesis since pain started, no reports of diarrhea, last BM was today. No CP, SHOB, or fevers reported. Pt denies previous h/x of abdominal surgeries. Pt is A&O, appears uncomfortable; RR even, regular, and unlabored.

## 2018-02-08 ENCOUNTER — Other Ambulatory Visit: Payer: Self-pay | Admitting: Nurse Practitioner

## 2018-02-08 DIAGNOSIS — Z1231 Encounter for screening mammogram for malignant neoplasm of breast: Secondary | ICD-10-CM

## 2018-06-25 ENCOUNTER — Other Ambulatory Visit: Payer: Self-pay

## 2018-06-25 ENCOUNTER — Emergency Department: Payer: BLUE CROSS/BLUE SHIELD

## 2018-06-25 ENCOUNTER — Observation Stay
Admission: EM | Admit: 2018-06-25 | Discharge: 2018-06-26 | Disposition: A | Discharge status: Home / Self Care | Payer: BLUE CROSS/BLUE SHIELD | Attending: Internal Medicine | Admitting: Internal Medicine

## 2018-06-25 ENCOUNTER — Encounter: Payer: Self-pay | Admitting: Emergency Medicine

## 2018-06-25 DIAGNOSIS — R0602 Shortness of breath: Secondary | ICD-10-CM | POA: Diagnosis not present

## 2018-06-25 DIAGNOSIS — I10 Essential (primary) hypertension: Secondary | ICD-10-CM | POA: Diagnosis not present

## 2018-06-25 DIAGNOSIS — Z8249 Family history of ischemic heart disease and other diseases of the circulatory system: Secondary | ICD-10-CM | POA: Diagnosis not present

## 2018-06-25 DIAGNOSIS — R079 Chest pain, unspecified: Secondary | ICD-10-CM | POA: Diagnosis present

## 2018-06-25 DIAGNOSIS — I249 Acute ischemic heart disease, unspecified: Secondary | ICD-10-CM | POA: Diagnosis present

## 2018-06-25 DIAGNOSIS — E876 Hypokalemia: Secondary | ICD-10-CM | POA: Insufficient documentation

## 2018-06-25 HISTORY — DX: Chest pain, unspecified: R07.9

## 2018-06-25 LAB — BASIC METABOLIC PANEL
Anion gap: 7 (ref 5–15)
BUN: 15 mg/dL (ref 6–20)
CHLORIDE: 100 mmol/L (ref 98–111)
CO2: 29 mmol/L (ref 22–32)
Calcium: 9.2 mg/dL (ref 8.9–10.3)
Creatinine, Ser: 0.8 mg/dL (ref 0.44–1.00)
GFR calc Af Amer: >60 mL/min (ref >60)
GFR calc non Af Amer: >60 mL/min (ref >60)
Glucose, Bld: 98 mg/dL (ref 70–99)
POTASSIUM: 3.5 mmol/L (ref 3.5–5.1)
Sodium: 136 mmol/L (ref 135–145)

## 2018-06-25 LAB — CBC WITH DIFFERENTIAL/PLATELET
Basophils Absolute: 0 10*3/uL (ref 0–0.1)
Basophils Relative: 0 %
EOS ABS: 0.1 10*3/uL (ref 0–0.7)
Eosinophils Relative: 2 %
HEMATOCRIT: 41.3 % (ref 35.0–47.0)
HEMOGLOBIN: 14.1 g/dL (ref 12.0–16.0)
LYMPHS ABS: 2 10*3/uL (ref 1.0–3.6)
LYMPHS PCT: 35 %
MCH: 29.3 pg (ref 26.0–34.0)
MCHC: 34.1 g/dL (ref 32.0–36.0)
MCV: 86 fL (ref 80.0–100.0)
Monocytes Absolute: 0.5 10*3/uL (ref 0.2–0.9)
Monocytes Relative: 8 %
NEUTROS ABS: 3.1 10*3/uL (ref 1.4–6.5)
NEUTROS PCT: 55 %
Platelets: 263 10*3/uL (ref 150–440)
RBC: 4.8 MIL/uL (ref 3.80–5.20)
RDW: 14.5 % (ref 11.5–14.5)
WBC: 5.7 10*3/uL (ref 3.6–11.0)

## 2018-06-25 LAB — TROPONIN I

## 2018-06-25 MED ORDER — TRAZODONE HCL 50 MG PO TABS: 25.0000 mg | ORAL_TABLET | Freq: Every evening | ORAL | Status: DC | PRN

## 2018-06-25 MED ORDER — DOCUSATE SODIUM 100 MG PO CAPS
100.0000 mg | ORAL_CAPSULE | Freq: Two times a day (BID) | ORAL | Status: DC
  Administered 2018-06-26: 100 mg via ORAL
  Filled 2018-06-25: qty 1

## 2018-06-25 MED ORDER — BISACODYL 5 MG PO TBEC: 5.0000 mg | DELAYED_RELEASE_TABLET | Freq: Every day | ORAL | Status: DC | PRN

## 2018-06-25 MED ORDER — ACETAMINOPHEN 650 MG RE SUPP: 650.0000 mg | Freq: Four times a day (QID) | RECTAL | Status: DC | PRN

## 2018-06-25 MED ORDER — ASPIRIN 325 MG PO TABS
325.0000 mg | ORAL_TABLET | Freq: Every day | ORAL | Status: DC
  Administered 2018-06-26: 325 mg via ORAL
  Filled 2018-06-25: qty 1

## 2018-06-25 MED ORDER — FAMOTIDINE 20 MG PO TABS
20.0000 mg | ORAL_TABLET | Freq: Every day | ORAL | Status: DC
  Administered 2018-06-26: 20 mg via ORAL
  Filled 2018-06-25: qty 1

## 2018-06-25 MED ORDER — SODIUM CHLORIDE 0.9 % IV SOLN
Freq: Once | INTRAVENOUS | Status: AC
  Administered 2018-06-26: 01:00:00 via INTRAVENOUS

## 2018-06-25 MED ORDER — LOSARTAN POTASSIUM-HCTZ 100-25 MG PO TABS: 1.0000 | ORAL_TABLET | Freq: Every day | ORAL | Status: DC

## 2018-06-25 MED ORDER — METOPROLOL TARTRATE 50 MG PO TABS
50.0000 mg | ORAL_TABLET | Freq: Every day | ORAL | Status: DC
  Administered 2018-06-26: 50 mg via ORAL
  Filled 2018-06-25: qty 1

## 2018-06-25 MED ORDER — ASPIRIN 81 MG PO CHEW
324.0000 mg | CHEWABLE_TABLET | Freq: Once | ORAL | Status: AC
  Administered 2018-06-25: 324 mg via ORAL
  Filled 2018-06-25: qty 4

## 2018-06-25 MED ORDER — ONDANSETRON HCL 4 MG/2ML IJ SOLN: 4.0000 mg | Freq: Four times a day (QID) | INTRAMUSCULAR | Status: DC | PRN

## 2018-06-25 MED ORDER — ACETAMINOPHEN 325 MG PO TABS: 650.0000 mg | ORAL_TABLET | Freq: Four times a day (QID) | ORAL | Status: DC | PRN

## 2018-06-25 MED ORDER — HYDROCODONE-ACETAMINOPHEN 5-325 MG PO TABS: 1.0000 | ORAL_TABLET | ORAL | Status: DC | PRN

## 2018-06-25 MED ORDER — HEPARIN SODIUM (PORCINE) 5000 UNIT/ML IJ SOLN
5000.0000 [IU] | Freq: Three times a day (TID) | INTRAMUSCULAR | Status: DC
  Administered 2018-06-26: 5000 [IU] via SUBCUTANEOUS
  Filled 2018-06-25 (×2): qty 1

## 2018-06-25 MED ORDER — ONDANSETRON HCL 4 MG PO TABS: 4.0000 mg | ORAL_TABLET | Freq: Four times a day (QID) | ORAL | Status: DC | PRN

## 2018-06-25 NOTE — ED Notes (Signed)
Pt c/o left upper arm tightness that has returned. Also c/o intermittent "grabbing" pain under right ribs.

## 2018-06-25 NOTE — H&P (Addendum)
[facility] [place] [place] NAME: Melissa Sparks    MR#:  [id number]  DATE OF BIRTH:  [birth year]  DATE OF ADMISSION:  06/25/2018  PRIMARY CARE PHYSICIAN: Perrin Maltese, MD   REQUESTING/REFERRING PHYSICIAN:   CHIEF COMPLAINT:   Chief Complaint  Patient presents with  . Chest Pain    HISTORY OF PRESENT ILLNESS: Melissa Sparks  is a 52 y.o. female with a known history of anemia and hypertension. Patient presented to emergency room for acute onset of severe central chest tightness, radiating to her neck and left arm associated with dizziness, shortness of breath, nausea and diaphoresis.  The pain started while she was driving and lasted about 30 to 35 minutes and then resolved on its own.  She is chest pain-free at this time.  Patient denies having similar episodes in the past.  There is positive family history of heart attack in her mother. Blood test done emergency room, including CBC and CMP are grossly unremarkable.  First troponin level is less than 0.03. EKG, reviewed by myself, shows normal sinus rhythm with a heart rate of 70 bpm, normal intervals, normal axis, no acute ischemic changes. Chest x-ray, is negative for any acute abnormalities. Patient is admitted for further evaluation and treatment.   PAST MEDICAL HISTORY:   Past Medical History:  Diagnosis Date  . Anemia   . Hypertension     PAST SURGICAL HISTORY:  Past Surgical History:  Procedure Laterality Date  . ABDOMINAL HYSTERECTOMY    . ANKLE FUSION Right 07/07/2016   Procedure: ARTHRODESIS ANKLE;  Surgeon: Samara Deist, DPM;  Location: [facility];  Service: Podiatry;  Laterality: Right;  . FLAT FOOT RECONSTRUCTION-TAL GASTROC RECESSION Right 07/07/2016   Procedure: FLAT FOOT RECONSTRUCTION-TAL GASTROC RECESSION;  Surgeon: Samara Deist, DPM;  Location: [facility];  Service: Podiatry;  Laterality: Right;  . HYSTEROSCOPY W/D&C  06/11/2015   Procedure: DILATATION AND CURETTAGE  /HYSTEROSCOPY;  Surgeon: Honor Loh Ward, MD;  Location: [facility];  Service: Gynecology;;  . LAPAROSCOPIC HYSTERECTOMY N/A 07/06/2015   Procedure: HYSTERECTOMY TOTAL LAPAROSCOPIC;  Surgeon: Honor Loh Ward, MD;  Location: [facility];  Service: Gynecology;  Laterality: N/A;  . LAPAROSCOPIC SALPINGO OOPHERECTOMY Bilateral 07/06/2015   Procedure: LAPAROSCOPIC SALPINGO OOPHORECTOMY;  Surgeon: Honor Loh Ward, MD;  Location: [facility];  Service: Gynecology;  Laterality: Bilateral;  . SYNOVECTOMY  07/07/2016   Procedure: Posterior tibial tendon tenosynovectomy;  Surgeon: Samara Deist, DPM;  Location: [facility];  Service: Podiatry;;  . TRIPLE SUBTALAR FUSION Right 07/07/2016   Procedure: TRIPLE SUBTALAR FUSION;  Surgeon: Samara Deist, DPM;  Location: [facility];  Service: Podiatry;  Laterality: Right;  . TUBAL LIGATION  1987    SOCIAL HISTORY:  Social History   Tobacco Use  . Smoking status: Former Smoker    Packs/day: 2.00    Years: 30.00    Pack years: 60.00    Types: Cigarettes    Last attempt to quit: 03/09/2015    Years since quitting: 3.2  . Smokeless tobacco: Never Used  Substance Use Topics  . Alcohol use: No    FAMILY HISTORY: Heart attack and patient's mother.  DRUG ALLERGIES:  Allergies  Allergen Reactions  . Latex Rash    SENSITIVITY. Found out during gyn appt    REVIEW OF SYSTEMS:   CONSTITUTIONAL: No fever, fatigue or weakness.  EYES: No blurred or double vision.  EARS, NOSE, AND THROAT: No tinnitus or ear pain.  RESPIRATORY: No cough, shortness of  breath, wheezing or hemoptysis.  CARDIOVASCULAR: Positive for chest pain, no orthopnea, edema.  GASTROINTESTINAL: Positive for nausea, no vomiting, diarrhea or abdominal pain.  GENITOURINARY: No dysuria, hematuria.  ENDOCRINE: No polyuria, nocturia,  HEMATOLOGY: No bleeding SKIN: No rash or lesion. MUSCULOSKELETAL: No joint pain or arthritis.   NEUROLOGIC: No tingling, numbness, weakness.  PSYCHIATRY: No anxiety or depression.    MEDICATIONS AT HOME:  Prior to Admission medications   Medication Sig Start Date End Date Taking? Authorizing Provider  acetaminophen (TYLENOL) 325 MG tablet Take 2 tablets (650 mg total) by mouth every 4 (four) hours. 07/07/15  Yes Ward, Honor Loh, MD  famotidine (PEPCID) 20 MG tablet Take 1 tablet (20 mg total) by mouth daily. 12/01/17 12/01/18 Yes McShane, Gerda Diss, MD  hydrochlorothiazide (HYDRODIURIL) 25 MG tablet Take 25 mg by mouth daily.   Yes [provider]  losartan (COZAAR) 50 MG tablet Take 1 tablet by mouth daily. 04/19/18  Yes [provider]  metoprolol (LOPRESSOR) 50 MG tablet Take 50 mg by mouth daily. Pt takes once daily (Rx originally stated 1 tablet twice daily)   Yes [provider]  ibuprofen (ADVIL,MOTRIN) 800 MG tablet Take 1 tablet (800 mg total) by mouth every 8 (eight) hours as needed. Patient not taking: Reported on 06/25/2018 06/07/16   Daymon Larsen, MD  losartan-hydrochlorothiazide [facility]) 100-25 MG per tablet Take 1 tablet by mouth daily.    [provider]  oxyCODONE-acetaminophen (PERCOCET) 7.5-325 MG tablet 1-2 Tablets q 4-6 hours prn pain Patient not taking: Reported on 06/25/2018 07/07/16   Samara Deist, DPM      PHYSICAL EXAMINATION:   VITAL SIGNS: Blood pressure 121/69, pulse 62, temperature 98.4 F (36.9 C), temperature source Oral, resp. rate 19, height 5\' 7"  (1.702 m), weight 113.4 kg (250 lb), last menstrual period 06/28/2015, SpO2 99 %.  GENERAL:  52 y.o.-year-old patient lying in the bed with no acute distress.  EYES: Pupils equal, round, reactive to light and accommodation. No scleral icterus. Extraocular muscles intact.  HEENT: Head atraumatic, normocephalic. Oropharynx and nasopharynx clear.  NECK:  Supple, no jugular venous distention. No thyroid enlargement, no tenderness.  LUNGS: Normal breath sounds bilaterally, no wheezing, rales,rhonchi or crepitation. No use of accessory muscles of respiration.   CARDIOVASCULAR: S1, S2 normal. No S3/S4.  ABDOMEN: Soft, nontender, nondistended. Bowel sounds present. No organomegaly or mass.  EXTREMITIES: No pedal edema, cyanosis, or clubbing.  NEUROLOGIC: Cranial nerves II through XII are intact. Muscle strength 5/5 in all extremities. Sensation intact. Gait not checked.  PSYCHIATRIC: The patient is alert and oriented x 3.  SKIN: No obvious rash, lesion, or ulcer.   LABORATORY PANEL:   CBC Recent Labs  Lab 06/25/18 2044  WBC 5.7  HGB 14.1  HCT 41.3  PLT 263  MCV 86.0  MCH 29.3  MCHC 34.1  RDW 14.5  LYMPHSABS 2.0  MONOABS 0.5  EOSABS 0.1  BASOSABS 0.0   ------------------------------------------------------------------------------------------------------------------  Chemistries  Recent Labs  Lab 06/25/18 2044  NA 136  K 3.5  CL 100  CO2 29  GLUCOSE 98  BUN 15  CREATININE 0.80  CALCIUM 9.2   ------------------------------------------------------------------------------------------------------------------ estimated creatinine clearance is 105.7 mL/min (by C-G formula based on SCr of 0.8 mg/dL). ------------------------------------------------------------------------------------------------------------------ No results for input(s): TSH, T4TOTAL, T3FREE, THYROIDAB in the last 72 hours.  Invalid input(s): FREET3   Coagulation profile No results for input(s): INR, PROTIME in the last 168 hours. ------------------------------------------------------------------------------------------------------------------- No results for input(s): DDIMER in the last 72 hours. -------------------------------------------------------------------------------------------------------------------  Cardiac Enzymes Recent Labs  Lab 06/25/18 2044  TROPONINI <0.03   ------------------------------------------------------------------------------------------------------------------ Invalid input(s):  POCBNP  ---------------------------------------------------------------------------------------------------------------  Urinalysis    Component Value Date/Time   COLORURINE STRAW (A) 12/01/2017 1348   APPEARANCEUR CLEAR (A) 12/01/2017 1348   APPEARANCEUR Clear 03/18/2015 1439   LABSPEC 1.005 12/01/2017 1348   LABSPEC 1.006 03/18/2015 1439   PHURINE 6.0 12/01/2017 1348   GLUCOSEU NEGATIVE 12/01/2017 1348   GLUCOSEU Negative 03/18/2015 1439   HGBUR MODERATE (A) 12/01/2017 1348   BILIRUBINUR NEGATIVE 12/01/2017 1348   BILIRUBINUR Negative 03/18/2015 1439   KETONESUR NEGATIVE 12/01/2017 1348   PROTEINUR NEGATIVE 12/01/2017 1348   NITRITE NEGATIVE 12/01/2017 1348   LEUKOCYTESUR NEGATIVE 12/01/2017 1348   LEUKOCYTESUR Negative 03/18/2015 1439     RADIOLOGY: Dg Chest 2 View  Result Date: 06/25/2018 CLINICAL DATA:  Chest pain EXAM: CHEST - 2 VIEW COMPARISON:  09/23/2007 FINDINGS: The heart size and mediastinal contours are within normal limits. Both lungs are clear. The visualized skeletal structures are unremarkable. IMPRESSION: No active cardiopulmonary disease. Electronically Signed   By: Inez Catalina M.D.   On: 06/25/2018 20:25    EKG: Orders placed or performed during the hospital encounter of 06/25/18  . ED EKG  . ED EKG    IMPRESSION AND PLAN:  1.  Chest pain, will rule out ACS.  Will start patient on aspirin.  Continue to monitor patient telemetry and follow troponin levels.  Will check 2D echo and have cardiology evaluate the patient for further management. 2.  Hypertension, stable, continue home medications. 3.  Tobacco abuse.  Edison Pace cessation was discussed with patient in detail.  All the records are reviewed and case discussed with ED provider. Management plans discussed with the patient, who is in agreement.  CODE STATUS: Full Code Status History    Date Active Date Inactive Code Status Order ID Comments User Context   07/06/2015 1457 07/07/2015 1456 Full Code  [id number]  Ward, Honor Loh, MD Inpatient       TOTAL TIME TAKING CARE OF THIS PATIENT: 45 minutes.    Amelia Jo M.D on 06/25/2018 at 10:10 PM  Between 7am to 6pm - Pager - [phone]  After 6pm go to [email or web address] - password [email or web address] [facility]  Office  [phone]  CC: Primary care physician; Perrin Maltese, MD

## 2018-06-25 NOTE — ED Provider Notes (Signed)
[facility] Emergency Department Provider Note  ____________________________________________  Time seen: Approximately 9:49 PM  I have reviewed the triage vital signs and the nursing notes.   HISTORY  Chief Complaint Chest Pain   HPI Melissa Sparks is a 52 y.o. female the history of smoking, hypertension, anemia who presents for evaluation of chest pain.  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  She reports that the pain lasted about 30 to 35 minutes and resolved.  She is asymptomatic at this time.  She denies ever having similar pain.  She has family history of heart attacks in her mother.  She denies any personal or family history of DVT/PE, no hemoptysis, no leg pain or swelling, no recent travel immobilization, no exogenous hormones. Past Medical History:  Diagnosis Date  . Anemia   . Hypertension     Patient Active Problem List   Diagnosis Date Noted  . S/P hysterectomy 07/06/2015    Past Surgical History:  Procedure Laterality Date  . ABDOMINAL HYSTERECTOMY    . ANKLE FUSION Right 07/07/2016   Procedure: ARTHRODESIS ANKLE;  Surgeon: Samara Deist, DPM;  Location: [facility] OR[facility];  Service: Podiatry;  Laterality: Right;  . FLAT FOOT RECONSTRUCTION-TAL GASTROC RECESSION Right 07/07/2016   Procedure: FLAT FOOT RECONSTRUCTION-TAL GASTROC RECESSION;  Surgeon: Samara Deist, DPM;  Location: [facility];  Service: Podiatry;  Laterality: Right;  . HYSTEROSCOPY W/D&C  06/11/2015   Procedure: DILATATION AND CURETTAGE /HYSTEROSCOPY;  Surgeon: Honor Loh Ward, MD;  Location: [facility];  Service: Gynecology;;  . LAPAROSCOPIC HYSTERECTOMY N/A 07/06/2015   Procedure: HYSTERECTOMY TOTAL LAPAROSCOPIC;  Surgeon: Honor Loh Ward, MD;  Location: [facility];  Service: Gynecology;   Laterality: N/A;  . LAPAROSCOPIC SALPINGO OOPHERECTOMY Bilateral 07/06/2015   Procedure: LAPAROSCOPIC SALPINGO OOPHORECTOMY;  Surgeon: Honor Loh Ward, MD;  Location: [facility];  Service: Gynecology;  Laterality: Bilateral;  . SYNOVECTOMY  07/07/2016   Procedure: Posterior tibial tendon tenosynovectomy;  Surgeon: Samara Deist, DPM;  Location: [facility];  Service: Podiatry;;  . TRIPLE SUBTALAR FUSION Right 07/07/2016   Procedure: TRIPLE SUBTALAR FUSION;  Surgeon: Samara Deist, DPM;  Location: [facility];  Service: Podiatry;  Laterality: Right;  . TUBAL LIGATION  1987    Prior to Admission medications   Medication Sig Start Date End Date Taking? Authorizing Provider  acetaminophen (TYLENOL) 325 MG tablet Take 2 tablets (650 mg total) by mouth every 4 (four) hours. 07/07/15   Ward, Honor Loh, MD  famotidine (PEPCID) 20 MG tablet Take 1 tablet (20 mg total) by mouth daily. 12/01/17 12/01/18  Schuyler Amor, MD  ibuprofen (ADVIL,MOTRIN) 800 MG tablet Take 1 tablet (800 mg total) by mouth every 8 (eight) hours as needed. 06/07/16   Daymon Larsen, MD  losartan-hydrochlorothiazide (HYZAAR) 100-25 MG per tablet Take 1 tablet by mouth daily.    [provider]  metoprolol (LOPRESSOR) 50 MG tablet Take 50 mg by mouth 2 (two) times daily.    [provider]  oxyCODONE-acetaminophen (PERCOCET) 7.5-325 MG tablet 1-2 Tablets q 4-6 hours prn pain 07/07/16   Samara Deist, DPM    Allergies Latex  No family history on file.  Social History Social History   Tobacco Use  . Smoking status: Former Smoker    Packs/day: 2.00    Years: 30.00  Pack years: 60.00    Types: Cigarettes    Last attempt to quit: 03/09/2015    Years since quitting: 3.2  . Smokeless tobacco: Never Used  Substance Use Topics  . Alcohol use: No  . Drug use: No    Review of Systems  Constitutional: Negative for fever. + dizziness Eyes: Negative for visual changes. ENT: Negative for sore throat. Neck: No neck pain    Cardiovascular: + chest pain, diaphoresis Respiratory: + shortness of breath. Gastrointestinal: Negative for abdominal pain, vomiting or diarrhea. Genitourinary: Negative for dysuria. Musculoskeletal: Negative for back pain. Skin: Negative for rash. Neurological: Negative for headaches, weakness or numbness. Psych: No SI or HI  ____________________________________________   PHYSICAL EXAM:  VITAL SIGNS: ED Triage Vitals  Enc Vitals Group     BP 06/25/18 2009 (!) 109/51     Pulse Rate 06/25/18 2009 72     Resp 06/25/18 2009 20     Temp 06/25/18 2009 98.4 F (36.9 C)     Temp Source 06/25/18 2009 Oral     SpO2 06/25/18 2009 99 %     Weight 06/25/18 2005 250 lb (113.4 kg)     Height 06/25/18 2005 5\' 7"  (1.702 m)     Head Circumference --      Peak Flow --      Pain Score 06/25/18 2005 0     Pain Loc --      Pain Edu? --      Excl. in [place]? --     Constitutional: Alert and oriented. Well appearing and in no apparent distress. HEENT:      Head: Normocephalic and atraumatic.         Eyes: Conjunctivae are normal. Sclera is non-icteric.       Mouth/Throat: Mucous membranes are moist.       Neck: Supple with no signs of meningismus. Cardiovascular: Regular rate and rhythm. No murmurs, gallops, or rubs. 2+ symmetrical distal pulses are present in all extremities. No JVD. Respiratory: Normal respiratory effort. Lungs are clear to auscultation bilaterally. No wheezes, crackles, or rhonchi.  Gastrointestinal: Soft, non tender, and non distended with positive bowel sounds. No rebound or guarding. Musculoskeletal: Nontender with normal range of motion in all extremities. No edema, cyanosis, or erythema of extremities. Neurologic: Normal speech and language. Face is symmetric. Moving all extremities. No gross focal neurologic deficits are appreciated. Skin: Skin is warm, dry and intact. No rash noted. Psychiatric: Mood and affect are normal. Speech and behavior are  normal.  ____________________________________________   LABS (all labs ordered are listed, but only abnormal results are displayed)  Labs Reviewed  CBC WITH DIFFERENTIAL/PLATELET  BASIC METABOLIC PANEL  TROPONIN I  TROPONIN I   ____________________________________________  EKG  ED ECG REPORT I, Rudene Re, the attending physician, personally viewed and interpreted this ECG.  Normal sinus rhythm, rate of 70, normal intervals, normal axis, no ST elevations or depressions.  Unchanged from prior from 2070 ____________________________________________  RADIOLOGY  I have personally reviewed the images performed during this visit and I agree with the Radiologist's read.   Interpretation by Radiologist:  Dg Chest 2 View  Result Date: 06/25/2018 CLINICAL DATA:  Chest pain EXAM: CHEST - 2 VIEW COMPARISON:  09/23/2007 FINDINGS: The heart size and mediastinal contours are within normal limits. Both lungs are clear. The visualized skeletal structures are unremarkable. IMPRESSION: No active cardiopulmonary disease. Electronically Signed   By: Inez Catalina M.D.   On: 06/25/2018 20:25     ____________________________________________  PROCEDURES  Procedure(s) performed: None Procedures Critical Care performed:  None ____________________________________________   INITIAL IMPRESSION / ASSESSMENT AND PLAN / ED COURSE  52 y.o. female the history of smoking, hypertension, anemia who presents for evaluation of chest pain.  Presentation is concerning for acute coronary syndrome.  Patient is asymptomatic at this time.  Initial EKG and troponin are negative.  We will give an aspirin and admit patient to the hospitalist service for further evaluation.      As part of my medical decision making, I reviewed the following data within the [facility] notes reviewed and incorporated, Labs reviewed , EKG interpreted , Old EKG reviewed, Old chart reviewed, Radiograph  reviewed , Discussed with admitting physician , Notes from prior ED visits and [place] Controlled Substance Database    Pertinent labs & imaging results that were available during my care of the patient were reviewed by me and considered in my medical decision making (see chart for details).    ____________________________________________   FINAL CLINICAL IMPRESSION(S) / ED DIAGNOSES  Final diagnoses:  Chest pain, unspecified type  ACS (acute coronary syndrome) ([place])      NEW MEDICATIONS STARTED DURING THIS VISIT:  ED Discharge Orders    None       Note:  This document was prepared using Dragon voice recognition software and may include unintentional dictation errors.    Rudene Re, MD 06/25/18 2152

## 2018-06-25 NOTE — ED Notes (Signed)
Attempted to call report x2. Placed on hold. Report given to next ER nurse.

## 2018-06-25 NOTE — ED Notes (Signed)
Attempted to call report x 1  

## 2018-06-25 NOTE — ED Triage Notes (Signed)
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 States she had picked up here at [facility], had made it to the mall and developed squeezing chest pain. States she also developed left sided jaw pain with pain down left arm. Reports she also became diaphoretic. Denies feeling like she had indigestion, but reports she felt like she needed to unbuckle her pants d/t discomfort. +Mild shortness of breath at one point.

## 2018-06-26 ENCOUNTER — Ambulatory Visit: Payer: BLUE CROSS/BLUE SHIELD

## 2018-06-26 ENCOUNTER — Encounter: Payer: Self-pay | Admitting: *Deleted

## 2018-06-26 ENCOUNTER — Observation Stay
Admit: 2018-06-26 | Discharge: 2018-06-26 | Disposition: A | Discharge status: Home / Self Care | Payer: BLUE CROSS/BLUE SHIELD | Attending: Internal Medicine | Admitting: Internal Medicine

## 2018-06-26 DIAGNOSIS — I5021 Acute systolic (congestive) heart failure: Secondary | ICD-10-CM

## 2018-06-26 LAB — BASIC METABOLIC PANEL
Anion gap: 6 (ref 5–15)
BUN: 14 mg/dL (ref 6–20)
CO2: 27 mmol/L (ref 22–32)
Calcium: 8.7 mg/dL — ABNORMAL LOW (ref 8.9–10.3)
Chloride: 104 mmol/L (ref 98–111)
Creatinine, Ser: 0.86 mg/dL (ref 0.44–1.00)
GFR calc Af Amer: >60 mL/min (ref >60)
GFR calc non Af Amer: >60 mL/min (ref >60)
Glucose, Bld: 125 mg/dL — ABNORMAL HIGH (ref 70–99)
Potassium: 3 mmol/L — ABNORMAL LOW (ref 3.5–5.1)
Sodium: 137 mmol/L (ref 135–145)

## 2018-06-26 LAB — NM MYOCAR MULTI W/SPECT W/WALL MOTION / EF
Estimated workload: 1 METS
Exercise duration (min): 1 min
Exercise duration (sec): 1 s
LVDIAVOL: 90 mL (ref 46–106)
LVSYSVOL: 30 mL
MPHR: 167 {beats}/min
NUC STRESS TID: 0.85
Peak HR: 111 {beats}/min
Percent HR: 66 %
Rest HR: 72 {beats}/min
SDS: 1
SRS: 0
SSS: 0

## 2018-06-26 LAB — CBC
HEMATOCRIT: 38.8 % (ref 35.0–47.0)
Hemoglobin: 13.1 g/dL (ref 12.0–16.0)
MCH: 29.3 pg (ref 26.0–34.0)
MCHC: 33.9 g/dL (ref 32.0–36.0)
MCV: 86.4 fL (ref 80.0–100.0)
Platelets: 227 10*3/uL (ref 150–440)
RBC: 4.49 MIL/uL (ref 3.80–5.20)
RDW: 14.5 % (ref 11.5–14.5)
WBC: 6.4 10*3/uL (ref 3.6–11.0)

## 2018-06-26 LAB — GLUCOSE, CAPILLARY: Glucose-Capillary: 103 mg/dL — ABNORMAL HIGH (ref 70–99)

## 2018-06-26 LAB — TROPONIN I: Troponin I: <0.03 ng/mL (ref <0.03)

## 2018-06-26 MED ORDER — REGADENOSON 0.4 MG/5ML IV SOLN
0.4000 mg | Freq: Once | INTRAVENOUS | Status: AC
  Administered 2018-06-26: 0.4 mg via INTRAVENOUS

## 2018-06-26 MED ORDER — POTASSIUM CHLORIDE ER 10 MEQ PO TBCR: 10.0000 meq | EXTENDED_RELEASE_TABLET | Freq: Every day | ORAL | 0 refills | Status: DC

## 2018-06-26 MED ORDER — PNEUMOCOCCAL VAC POLYVALENT 25 MCG/0.5ML IJ INJ: 0.5000 mL | INJECTION | INTRAMUSCULAR | Status: DC

## 2018-06-26 MED ORDER — HYDROCHLOROTHIAZIDE 25 MG PO TABS: 25.0000 mg | ORAL_TABLET | Freq: Every day | ORAL | Status: DC

## 2018-06-26 MED ORDER — LOSARTAN POTASSIUM 50 MG PO TABS
100.0000 mg | ORAL_TABLET | Freq: Every day | ORAL | Status: DC
  Administered 2018-06-26: 100 mg via ORAL
  Filled 2018-06-26: qty 2

## 2018-06-26 MED ORDER — TECHNETIUM TC 99M TETROFOSMIN IV KIT
12.4100 | PACK | Freq: Once | INTRAVENOUS | Status: AC | PRN
  Administered 2018-06-26: 12.41 via INTRAVENOUS

## 2018-06-26 MED ORDER — PERFLUTREN LIPID MICROSPHERE
1.0000 mL | INTRAVENOUS | Status: AC | PRN
  Administered 2018-06-26: 2 mL via INTRAVENOUS
  Filled 2018-06-26: qty 10

## 2018-06-26 MED ORDER — TECHNETIUM TC 99M TETROFOSMIN IV KIT
31.8930 | PACK | Freq: Once | INTRAVENOUS | Status: AC | PRN
  Administered 2018-06-26: 31.893 via INTRAVENOUS

## 2018-06-26 MED ORDER — POTASSIUM CHLORIDE CRYS ER 20 MEQ PO TBCR
40.0000 meq | EXTENDED_RELEASE_TABLET | Freq: Once | ORAL | Status: AC
  Administered 2018-06-26: 40 meq via ORAL
  Filled 2018-06-26: qty 2

## 2018-06-26 NOTE — Discharge Summary (Signed)
[facility] [place] NAME: Melissa Sparks    MR#:  [id number]  DATE OF BIRTH:  01-21-1966  DATE OF ADMISSION:  06/25/2018 ADMITTING PHYSICIAN: Amelia Jo, MD  DATE OF DISCHARGE: 06/26/2018  PRIMARY CARE PHYSICIAN: Perrin Maltese, MD    ADMISSION DIAGNOSIS:  ACS (acute coronary syndrome) ([place]) [I24.9] Chest pain, unspecified type [R07.9]  DISCHARGE DIAGNOSIS:  Active Problems:   Chest pain   SECONDARY DIAGNOSIS:   Past Medical History:  Diagnosis Date  . Anemia   . Hypertension     HOSPITAL COURSE:   1.  Chest pain and shortness of breath.  Patient's cardiac enzymes were negative and patient had a stress test that was a low risk study.  Patient felt well and wanted to go home.  Since patient was feeling well without symptoms she wanted to go home and declined any further testing. 2.  Hypokalemia.  Replace potassium.  I explained that the hydrochlorothiazide can cause hypokalemia.  She did not want to stop the hydrochlorothiazide.  I prescribed potassium upon going home. 3.  Essential hypertension.  Continue her usual medications 4.  Tobacco abuse.   DISCHARGE CONDITIONS:   Satisfactory  CONSULTS OBTAINED:  Treatment Team:  Yolonda Kida, MD  DRUG ALLERGIES:   Allergies  Allergen Reactions  . Latex Rash    SENSITIVITY. Found out during gyn appt    DISCHARGE MEDICATIONS:   Allergies as of 06/26/2018      Reactions   Latex Rash   SENSITIVITY. Found out during gyn appt      Medication List    STOP taking these medications   hydrochlorothiazide 25 MG tablet Commonly known as:  HYDRODIURIL   ibuprofen 800 MG tablet Commonly known as:  ADVIL,MOTRIN   losartan 50 MG tablet Commonly known as:  COZAAR   oxyCODONE-acetaminophen 7.5-325 MG tablet Commonly known as:  PERCOCET     TAKE these medications   acetaminophen 325 MG tablet Commonly known as:  TYLENOL Take 2 tablets (650 mg total) by mouth every  4 (four) hours.   famotidine 20 MG tablet Commonly known as:  PEPCID Take 1 tablet (20 mg total) by mouth daily.   losartan-hydrochlorothiazide 100-25 MG tablet Commonly known as:  HYZAAR Take 1 tablet by mouth daily.   metoprolol tartrate 50 MG tablet Commonly known as:  LOPRESSOR Take 50 mg by mouth daily. Pt takes once daily (Rx originally stated 1 tablet twice daily)   potassium chloride 10 MEQ tablet Commonly known as:  K-DUR Take 1 tablet (10 mEq total) by mouth daily.        DISCHARGE INSTRUCTIONS:   Follow-up PMD 6 days  If you experience worsening of your admission symptoms, develop shortness of breath, life threatening emergency, suicidal or homicidal thoughts you must seek medical attention immediately by calling 911 or calling your MD immediately  if symptoms less severe.  You Must read complete instructions/literature along with all the possible adverse reactions/side effects for all the Medicines you take and that have been prescribed to you. Take any new Medicines after you have completely understood and accept all the possible adverse reactions/side effects.   Please note  You were cared for by a hospitalist during your hospital stay. If you have any questions about your discharge medications or the care you received while you were in the hospital after you are discharged, you can call the unit and asked to speak with the hospitalist on call if the  hospitalist that took care of you is not available. Once you are discharged, your primary care physician will handle any further medical issues. Please note that NO REFILLS for any discharge medications will be authorized once you are discharged, as it is imperative that you return to your primary care physician (or establish a relationship with a primary care physician if you do not have one) for your aftercare needs so that they can reassess your need for medications and monitor your lab values.    Today   CHIEF  COMPLAINT:   Chief Complaint  Patient presents with  . Chest Pain    HISTORY OF PRESENT ILLNESS:  Melissa Sparks  is a 52 y.o. female  came in with chest pain    VITAL SIGNS:  Blood pressure 118/66, pulse 79, temperature 98 F (36.7 C), temperature source Oral, resp. rate 18, height 5\' 7"  (1.702 m), weight 117 kg (258 lb), last menstrual period 06/28/2015, SpO2 100 %.   PHYSICAL EXAMINATION:  GENERAL:  52 y.o.-year-old patient lying in the bed with no acute distress.  EYES: Pupils equal, round, reactive to light and accommodation. No scleral icterus. Extraocular muscles intact.  HEENT: Head atraumatic, normocephalic. Oropharynx and nasopharynx clear.  NECK:  Supple, no jugular venous distention. No thyroid enlargement, no tenderness.  LUNGS: Normal breath sounds bilaterally, no wheezing, rales,rhonchi or crepitation. No use of accessory muscles of respiration.  CARDIOVASCULAR: S1, S2 normal. No murmurs, rubs, or gallops.  ABDOMEN: Soft, non-tender, non-distended. Bowel sounds present. No organomegaly or mass.  EXTREMITIES: No pedal edema, cyanosis, or clubbing.  NEUROLOGIC: Cranial nerves II through XII are intact. Muscle strength 5/5 in all extremities. Sensation intact. Gait not checked.  PSYCHIATRIC: The patient is alert and oriented x 3.  SKIN: No obvious rash, lesion, or ulcer.   DATA REVIEW:   CBC Recent Labs  Lab 06/26/18 0242  WBC 6.4  HGB 13.1  HCT 38.8  PLT 227    Chemistries  Recent Labs  Lab 06/26/18 0242  NA 137  K 3.0*  CL 104  CO2 27  GLUCOSE 125*  BUN 14  CREATININE 0.86  CALCIUM 8.7*    Cardiac Enzymes Recent Labs  Lab 06/26/18 0242  TROPONINI <0.03     RADIOLOGY:  Dg Chest 2 View  Result Date: 06/25/2018 CLINICAL DATA:  Chest pain EXAM: CHEST - 2 VIEW COMPARISON:  09/23/2007 FINDINGS: The heart size and mediastinal contours are within normal limits. Both lungs are clear. The visualized skeletal structures are unremarkable.  IMPRESSION: No active cardiopulmonary disease. Electronically Signed   By: Inez Catalina M.D.   On: 06/25/2018 20:25   Nm Myocar Multi W/spect W/wall Motion / Ef  Result Date: 06/26/2018  There was no ST segment deviation noted during stress.  This is a low risk study.  Nuclear stress EF: 45%.  The left ventricular ejection fraction is mildly decreased (45-54%).  Conclusion Borderline overall left ventricular function ejection fraction between 45 to 50% Adequate chemical stress    Management plans discussed with the patient, family and they are in agreement.  CODE STATUS:     Code Status Orders  (From admission, onward)        Start     Ordered   06/26/18 0000  Full code  Continuous     06/25/18 2359    Code Status History    Date Active Date Inactive Code Status Order ID Comments User Context   07/06/2015 1457 07/07/2015 1456 Full Code [id number]  Ward,  Honor Loh, MD Inpatient      TOTAL TIME TAKING CARE OF THIS PATIENT: 34 minutes.    Loletha Grayer M.D on 06/26/2018 at 4:04 PM  Between 7am to 6pm - Pager - [phone]  After 6pm go to [email or web address] - password [email or web address] [facility]  [phone]  CC: Primary care physician; Perrin Maltese, MD

## 2018-06-26 NOTE — Plan of Care (Signed)
  Problem: Activity: Goal: Risk for activity intolerance will decrease Outcome: Progressing   Problem: Pain Managment: Goal: General experience of comfort will improve Outcome: Progressing   Problem: Safety: Goal: Ability to remain free from injury will improve Outcome: Progressing   Problem: Skin Integrity: Goal: Risk for impaired skin integrity will decrease Outcome: Progressing   Problem: Education: Goal: Knowledge of General Education information will improve Description Including pain rating scale, medication(s)/side effects and non-pharmacologic comfort measures Outcome: Completed/Met

## 2018-06-26 NOTE — Care Management Note (Signed)
Case Management Note  Patient Details  Name: Melissa Sparks MRN: [id number] Date of Birth: Feb 08, 1966  Subjective/Objective:                 Placed in observation for chest pain. Troponins negative thus far. Cardiology consult pending   Action/Plan:   Expected Discharge Date:                  Expected Discharge Plan:  Home/Self Care  In-House Referral:     Discharge planning Services     Post Acute Care Choice:    Choice offered to:     DME Arranged:    DME Agency:     HH Arranged:    HH Agency:     Status of Service:  In process, will continue to follow  If discussed at Long Length of Stay Meetings, dates discussed:    Additional Comments:  Katrina Stack, RN 06/26/2018, 10:15 AM

## 2018-06-26 NOTE — Progress Notes (Signed)
*  PRELIMINARY RESULTS* Echocardiogram 2D Echocardiogram has been performed.  Melissa Sparks 06/26/2018, 12:28 PM

## 2018-06-27 LAB — ECHOCARDIOGRAM COMPLETE
HEIGHTINCHES: 67 in
WEIGHTICAEL: 4128 [oz_av]

## 2018-06-27 LAB — HIV ANTIBODY (ROUTINE TESTING W REFLEX): HIV Screen 4th Generation wRfx: NONREACTIVE

## 2019-02-23 ENCOUNTER — Other Ambulatory Visit: Payer: Self-pay

## 2019-02-23 ENCOUNTER — Encounter: Payer: Self-pay | Admitting: Emergency Medicine

## 2019-02-23 ENCOUNTER — Emergency Department
Admission: EM | Admit: 2019-02-23 | Discharge: 2019-02-23 | Disposition: A | Discharge status: Home / Self Care | Payer: BLUE CROSS/BLUE SHIELD | Attending: Emergency Medicine | Admitting: Emergency Medicine

## 2019-02-23 DIAGNOSIS — R509 Fever, unspecified: Secondary | ICD-10-CM | POA: Diagnosis not present

## 2019-02-23 DIAGNOSIS — I10 Essential (primary) hypertension: Secondary | ICD-10-CM | POA: Insufficient documentation

## 2019-02-23 DIAGNOSIS — Z79899 Other long term (current) drug therapy: Secondary | ICD-10-CM | POA: Diagnosis not present

## 2019-02-23 DIAGNOSIS — J111 Influenza due to unidentified influenza virus with other respiratory manifestations: Secondary | ICD-10-CM

## 2019-02-23 DIAGNOSIS — R05 Cough: Secondary | ICD-10-CM | POA: Diagnosis present

## 2019-02-23 DIAGNOSIS — M791 Myalgia, unspecified site: Secondary | ICD-10-CM | POA: Insufficient documentation

## 2019-02-23 DIAGNOSIS — R69 Illness, unspecified: Secondary | ICD-10-CM

## 2019-02-23 NOTE — ED Provider Notes (Signed)
[facility] Emergency Department Provider Note  ____________________________________________   None    (approximate)  I have reviewed the triage vital signs and the nursing notes.   HISTORY  Chief Complaint Headache; Shortness of Breath; and Cough    HPI Melissa Sparks is a 53 y.o. female presents to the emergency department with URI symptoms.  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 Anemia   . Hypertension     Patient Active Problem List   Diagnosis Date Noted  . Chest pain 06/25/2018  . S/P hysterectomy 07/06/2015    Past Surgical History:  Procedure Laterality Date  . ABDOMINAL HYSTERECTOMY    . ANKLE FUSION Right 07/07/2016   Procedure: ARTHRODESIS ANKLE;  Surgeon: Samara Deist, DPM;  Location: [facility] ORS;  Service: Podiatry;  Laterality: Right;  . FLAT FOOT RECONSTRUCTION-TAL GASTROC RECESSION Right 07/07/2016   Procedure: FLAT FOOT RECONSTRUCTION-TAL GASTROC RECESSION;  Surgeon: Samara Deist, DPM;  Location: [facility];  Service: Podiatry;  Laterality: Right;  . HYSTEROSCOPY W/D&C  06/11/2015   Procedure: DILATATION AND CURETTAGE /HYSTEROSCOPY;  Surgeon: Honor Loh Ward, MD;  Location: [facility] OR[facility];  Service: Gynecology;;  . LAPAROSCOPIC HYSTERECTOMY N/A 07/06/2015   Procedure: HYSTERECTOMY TOTAL LAPAROSCOPIC;  Surgeon: Honor Loh Ward, MD;  Location: [facility];  Service: Gynecology;  Laterality: N/A;  . LAPAROSCOPIC SALPINGO OOPHERECTOMY Bilateral 07/06/2015   Procedure: LAPAROSCOPIC SALPINGO OOPHORECTOMY;  Surgeon: Honor Loh Ward, MD;  Location: [facility];  Service: Gynecology;  Laterality: Bilateral;  . SYNOVECTOMY  07/07/2016   Procedure: Posterior tibial tendon tenosynovectomy;   Surgeon: Samara Deist, DPM;  Location: [facility];  Service: Podiatry;;  . TRIPLE SUBTALAR FUSION Right 07/07/2016   Procedure: TRIPLE SUBTALAR FUSION;  Surgeon: Samara Deist, DPM;  Location: [facility];  Service: Podiatry;  Laterality: Right;  . TUBAL LIGATION  1987    Prior to Admission medications   Medication Sig Start Date End Date Taking? Authorizing Provider  acetaminophen (TYLENOL) 325 MG tablet Take 2 tablets (650 mg total) by mouth every 4 (four) hours. 07/07/15   Ward, Honor Loh, MD  famotidine (PEPCID) 20 MG tablet Take 1 tablet (20 mg total) by mouth daily. 12/01/17 12/01/18  Schuyler Amor, MD  losartan-hydrochlorothiazide (HYZAAR) 100-25 MG per tablet Take 1 tablet by mouth daily.    [provider]  metoprolol (LOPRESSOR) 50 MG tablet Take 50 mg by mouth daily. Pt takes once daily (Rx originally stated 1 tablet twice daily)    [provider]  potassium chloride (K-DUR) 10 MEQ tablet Take 1 tablet (10 mEq total) by mouth daily. 06/26/18   Loletha Grayer, MD    Allergies Latex  No family history on file.  Social History Social History   Tobacco Use  . Smoking status: Former Smoker    Packs/day: 2.00    Years: 30.00    Pack years: 60.00    Types: Cigarettes    Last attempt to quit: 03/09/2015    Years since quitting: 3.9  . Smokeless tobacco: Never Used  Substance Use Topics  . Alcohol use: No  . Drug use: No    Review of Systems  Constitutional: positive  fever/chills Eyes: No visual changes. ENT: denies  sore throat. Respiratory: positive cough Genitourinary: Negative for dysuria. Musculoskeletal: Negative for back pain. Skin: Negative for rash.    ____________________________________________   PHYSICAL EXAM:  VITAL SIGNS: ED Triage Vitals  Enc Vitals Group     BP 02/23/19 1230 125/60     Pulse Rate 02/23/19 1230 66     Resp 02/23/19 1230 18     Temp 02/23/19 1230 98.4 F (36.9 C)     Temp Source 02/23/19 1230 Oral     SpO2  02/23/19 1230 100 %     Weight 02/23/19 1231 250 lb (113.4 kg)     Height 02/23/19 1231 5\' 7"  (1.702 m)     Head Circumference --      Peak Flow --      Pain Score 02/23/19 1231 5     Pain Loc --      Pain Edu? --      Excl. in [place]? --     Constitutional: Alert and oriented. Well appearing and in no acute distress. Eyes: Conjunctivae are normal.  Head: Atraumatic. Nose: No congestion/rhinnorhea. Mouth/Throat: Mucous membranes are moist.  Throat is normal Neck:  supple no lymphadenopathy noted Cardiovascular: Normal rate, regular rhythm. Heart sounds are normal Respiratory: Normal respiratory effort.  No retractions, lungs c t a GU: deferred Musculoskeletal: FROM all extremities, warm and well perfused Neurologic:  Normal speech and language.  Skin:  Skin is warm, dry and intact. No rash noted. Psychiatric: Mood and affect are normal. Speech and behavior are normal.  ____________________________________________   LABS (all labs ordered are listed, but only abnormal results are displayed)  Labs Reviewed - No data to display ____________________________________________   ____________________________________________  RADIOLOGY    ____________________________________________   PROCEDURES  Procedure(s) performed: No  Procedures    ____________________________________________   INITIAL IMPRESSION / ASSESSMENT AND PLAN / ED COURSE  Pertinent labs & imaging results that were available during my care of the patient were reviewed by me and considered in my medical decision making (see chart for details).   Patient is a 53 y/o who complains of URI symptoms.  Basic medical  exam was performed. The patient was instructed to quarantine themselves at home.  Follow-up with your regular doctor if any concerns.  Return emergency department for worsening. She was dx with influenza and given a prescription for tamilfu     As part of my medical decision making, I reviewed the  following data within the [place] notes reviewed and incorporated, Old chart reviewed, Notes from prior ED visits and [place] Controlled Substance Database  ____________________________________________   FINAL CLINICAL IMPRESSION(S) / ED DIAGNOSES  Final diagnoses:  Influenza-like illness      NEW MEDICATIONS STARTED DURING THIS VISIT:  Discharge Medication List as of 02/23/2019 12:53 PM       Note:  This document was prepared using Dragon voice recognition software and may include unintentional dictation errors.    Versie Starks, PA-C 02/23/19 1255    Hinda Kehr, MD 02/23/19 [phone]

## 2019-02-23 NOTE — ED Triage Notes (Signed)
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

## 2019-02-23 NOTE — ED Notes (Signed)
PA with pt.

## 2019-02-23 NOTE — ED Notes (Signed)
No esign present at this time. Pt verbalized understanding of d/c instructions.

## 2020-05-19 ENCOUNTER — Other Ambulatory Visit: Payer: Self-pay | Admitting: Nurse Practitioner

## 2020-05-19 DIAGNOSIS — Z1231 Encounter for screening mammogram for malignant neoplasm of breast: Secondary | ICD-10-CM

## 2021-12-08 ENCOUNTER — Other Ambulatory Visit: Payer: Self-pay | Admitting: Nurse Practitioner

## 2021-12-08 DIAGNOSIS — Z1231 Encounter for screening mammogram for malignant neoplasm of breast: Secondary | ICD-10-CM

## 2021-12-09 ENCOUNTER — Ambulatory Visit
Admission: RE | Admit: 2021-12-09 | Discharge: 2021-12-09 | Disposition: A | Discharge status: Home / Self Care | Payer: BC Managed Care – PPO | Attending: Nurse Practitioner | Admitting: Nurse Practitioner

## 2021-12-09 ENCOUNTER — Other Ambulatory Visit: Payer: Self-pay | Admitting: Nurse Practitioner

## 2021-12-09 ENCOUNTER — Ambulatory Visit
Admission: RE | Admit: 2021-12-09 | Discharge: 2021-12-09 | Disposition: A | Discharge status: Home / Self Care | Payer: BC Managed Care – PPO | Source: Ambulatory Visit | Attending: Nurse Practitioner | Admitting: Nurse Practitioner

## 2021-12-09 ENCOUNTER — Other Ambulatory Visit: Payer: Self-pay

## 2021-12-09 DIAGNOSIS — R52 Pain, unspecified: Secondary | ICD-10-CM

## 2022-04-14 IMAGING — CR DG HIP (WITH OR WITHOUT PELVIS) 2-3V*R*
1 series · 3 of 3 positions shown · non-contrast
Comparison: None.

CLINICAL DATA: Right hip pain x2 weeks.

EXAM:
DG HIP (WITH OR WITHOUT PELVIS) 2-3V RIGHT

[Series 1: dg hip unilat w or w/o pelvis 2-3 views  · non-contrast · 0.14mm/px · 3 of 3 slices shown]
[im 1/3]
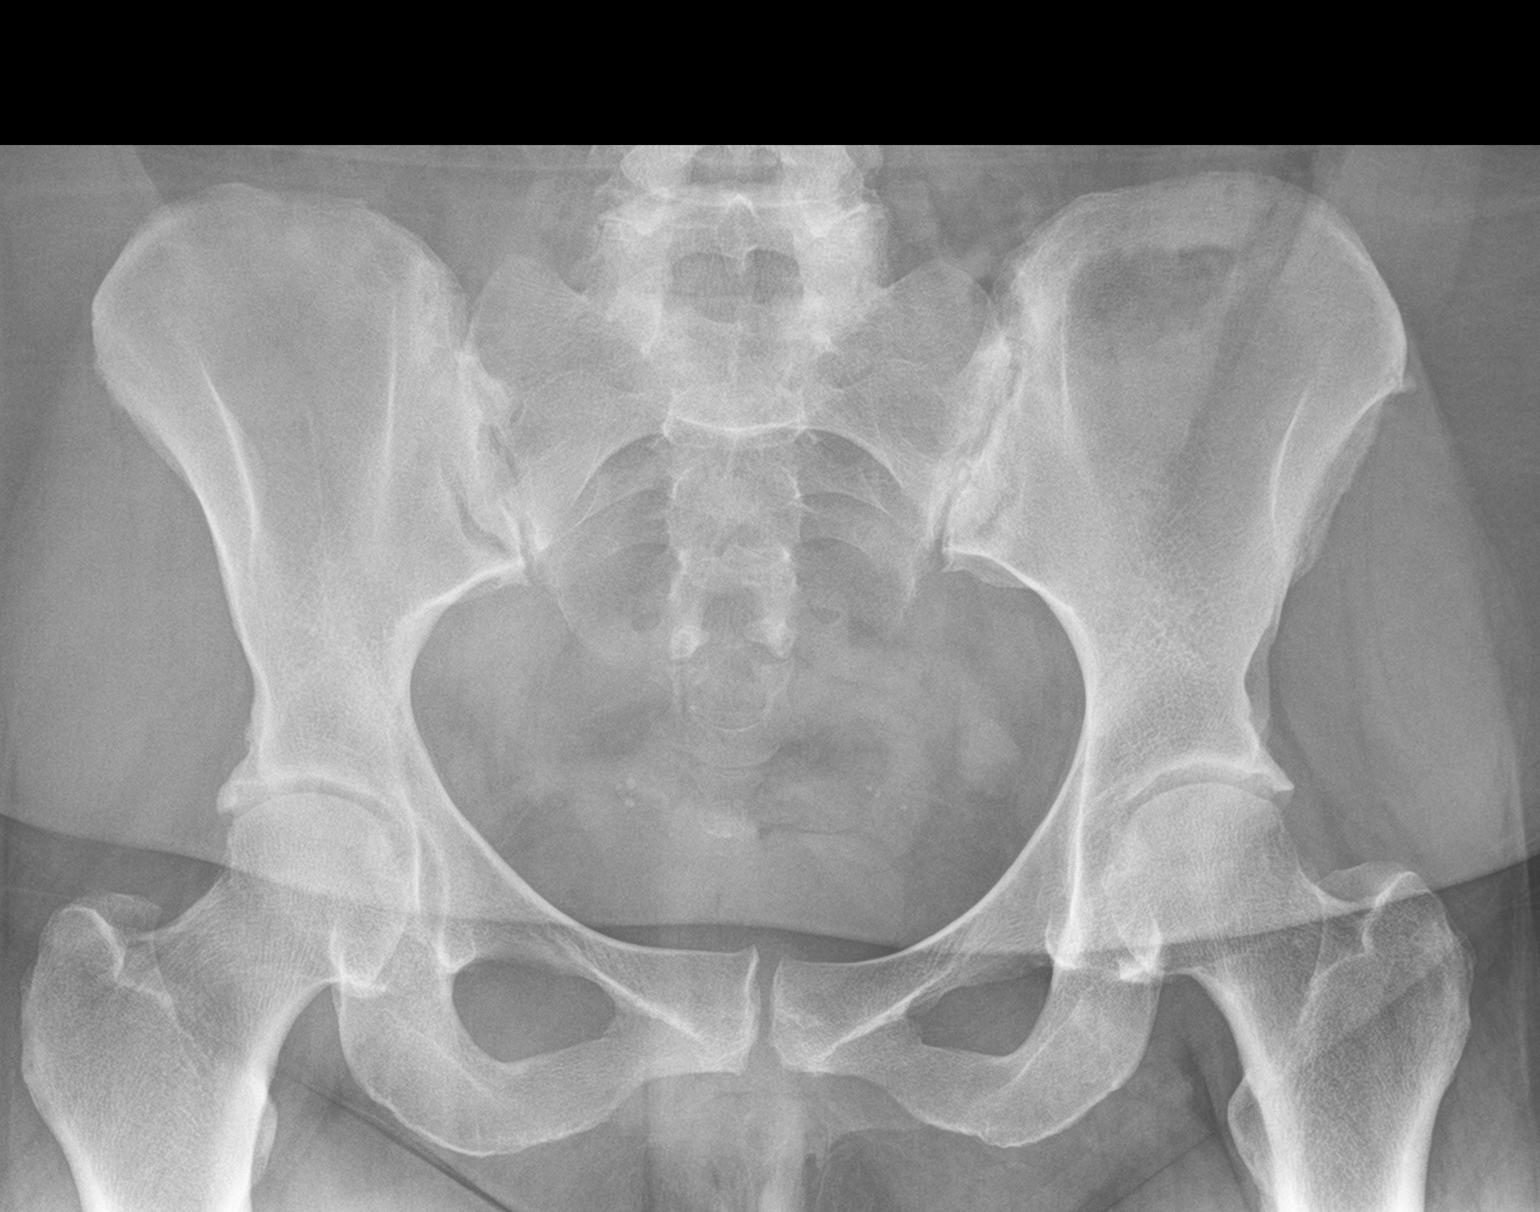
[im 2/3]
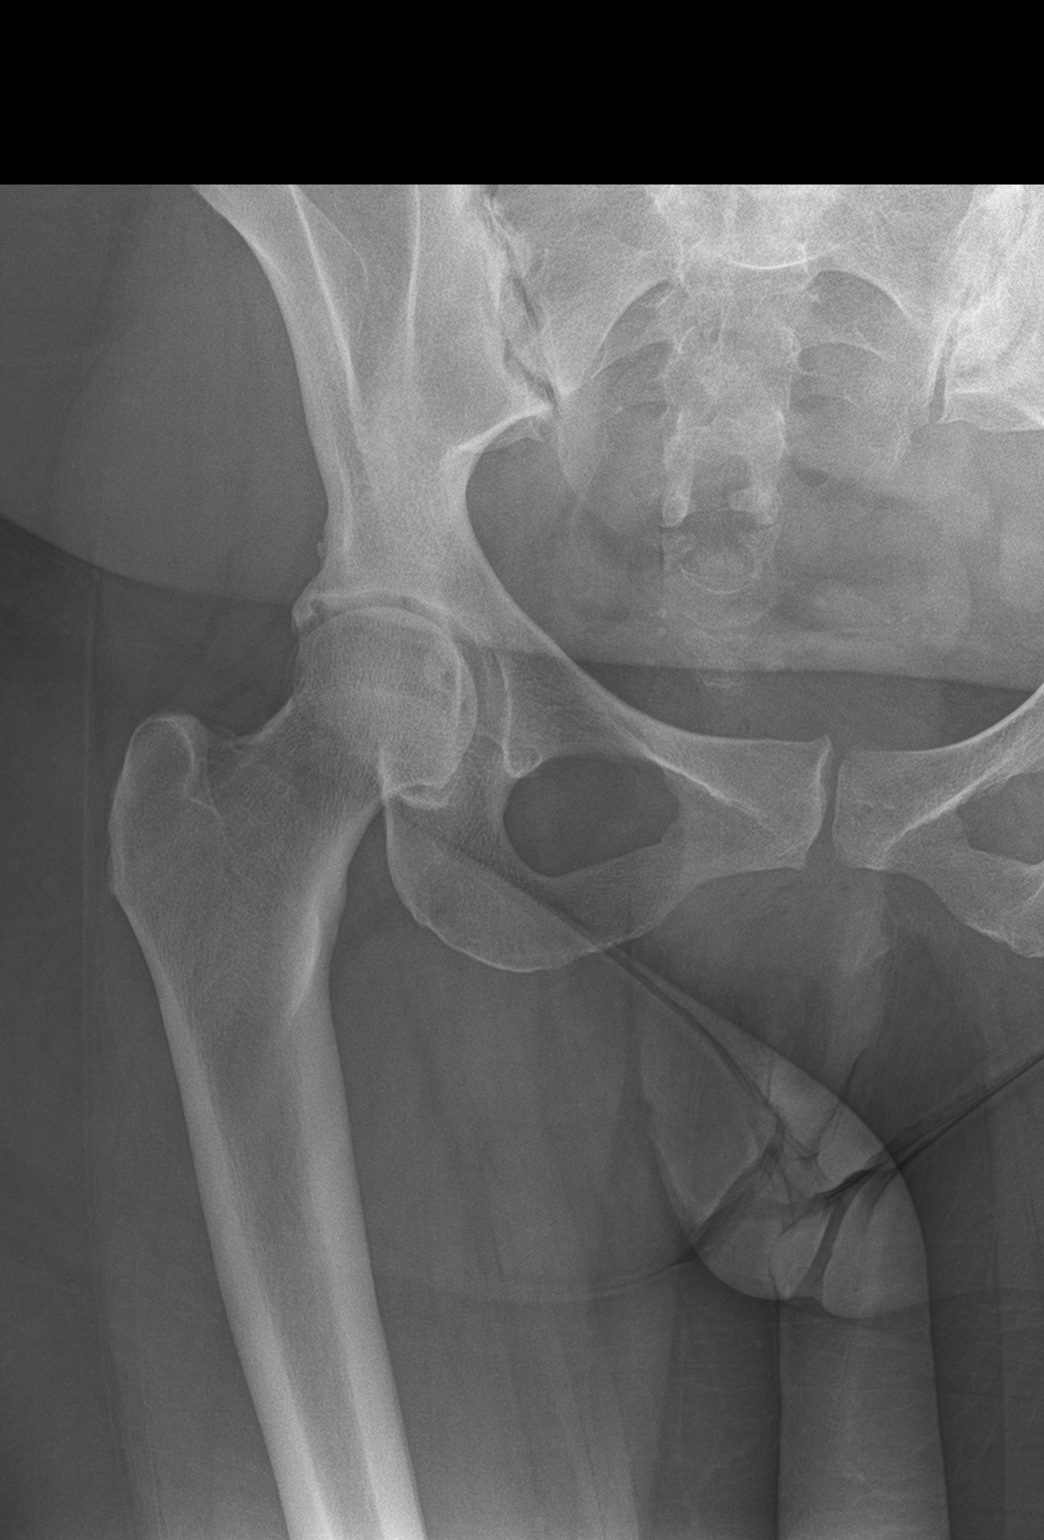
[im 3/3]
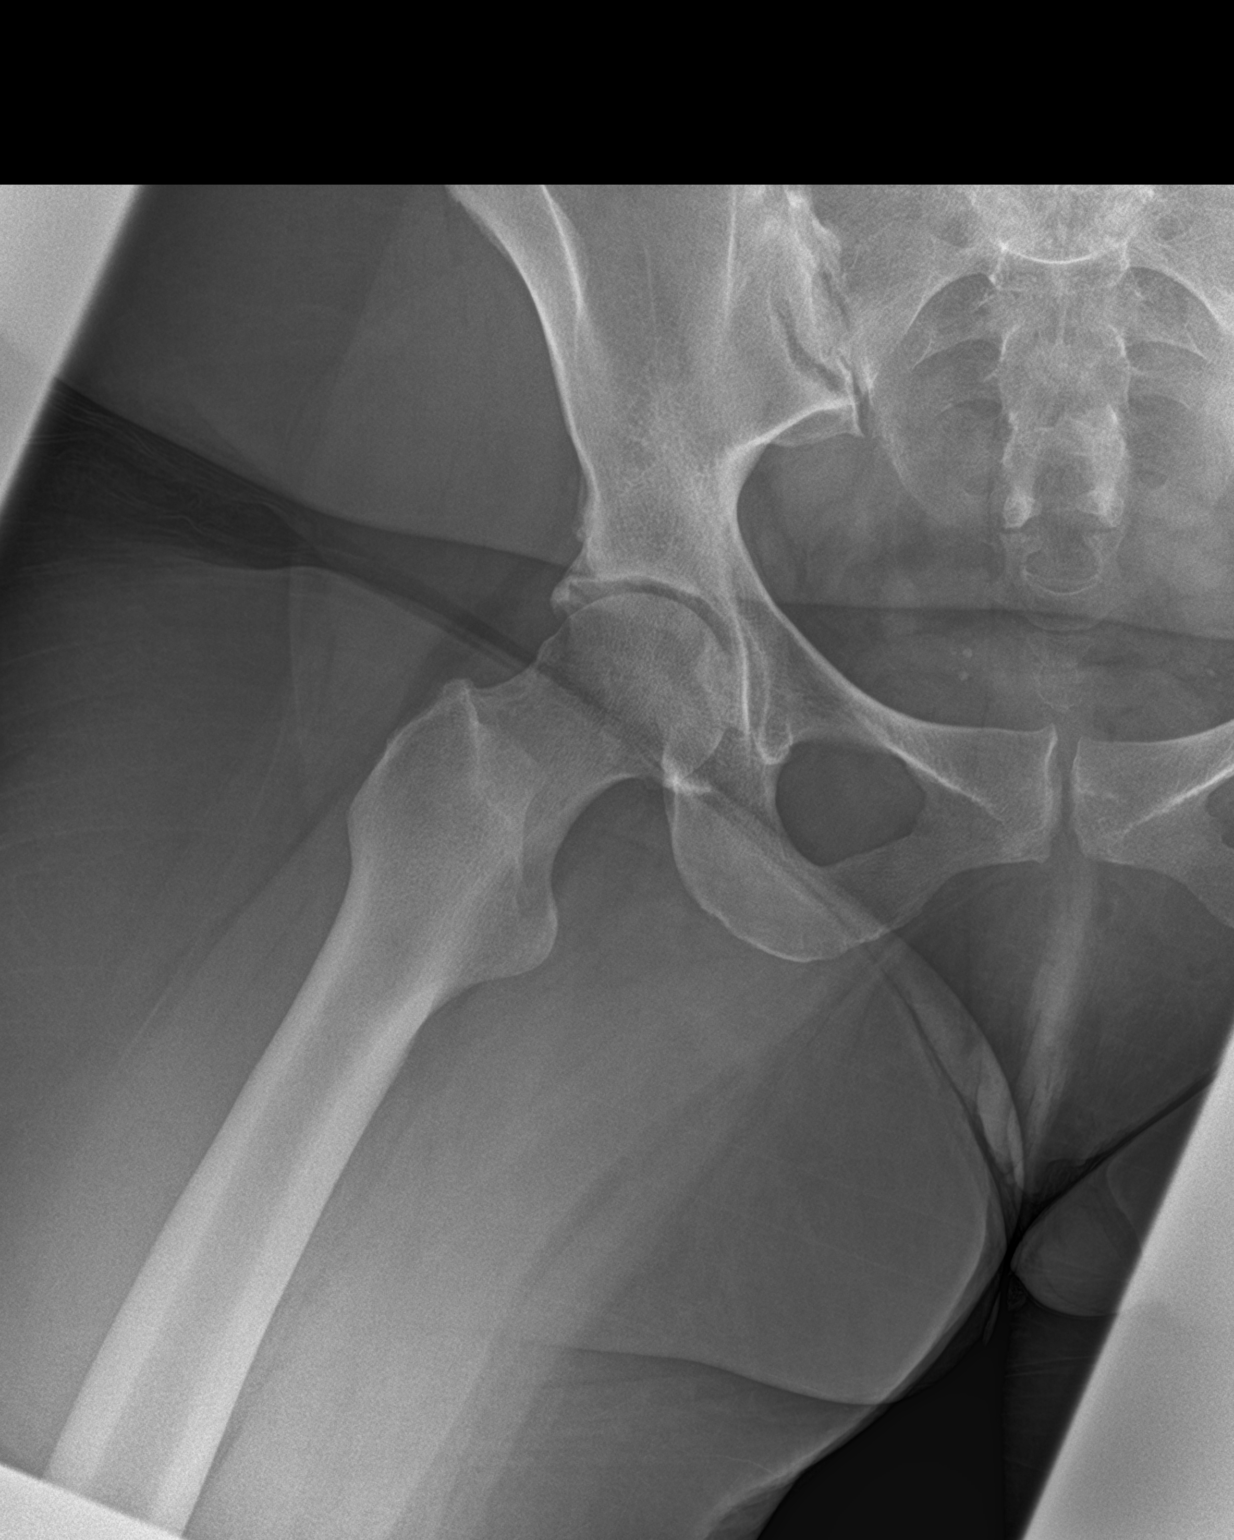

[3 of 3 positions shown; findings below may reference images not displayed]

FINDINGS: There is no evidence of hip fracture or dislocation. Mild
degenerative changes are seen in the form of lateral acetabular bony
spurring.
IMPRESSION: No acute osseous abnormality.

## 2022-07-13 DIAGNOSIS — Z0001 Encounter for general adult medical examination with abnormal findings: Secondary | ICD-10-CM | POA: Diagnosis not present

## 2022-07-13 DIAGNOSIS — I10 Essential (primary) hypertension: Secondary | ICD-10-CM | POA: Diagnosis not present

## 2022-07-13 DIAGNOSIS — E6609 Other obesity due to excess calories: Secondary | ICD-10-CM | POA: Diagnosis not present

## 2022-07-13 DIAGNOSIS — E785 Hyperlipidemia, unspecified: Secondary | ICD-10-CM | POA: Diagnosis not present

## 2022-07-13 DIAGNOSIS — R7301 Impaired fasting glucose: Secondary | ICD-10-CM | POA: Diagnosis not present

## 2022-07-14 DIAGNOSIS — I10 Essential (primary) hypertension: Secondary | ICD-10-CM | POA: Diagnosis not present

## 2022-07-14 DIAGNOSIS — R5383 Other fatigue: Secondary | ICD-10-CM | POA: Diagnosis not present

## 2022-07-14 DIAGNOSIS — M545 Low back pain, unspecified: Secondary | ICD-10-CM | POA: Diagnosis not present

## 2022-07-14 DIAGNOSIS — R319 Hematuria, unspecified: Secondary | ICD-10-CM | POA: Diagnosis not present

## 2022-07-14 DIAGNOSIS — R7301 Impaired fasting glucose: Secondary | ICD-10-CM | POA: Diagnosis not present

## 2022-07-14 DIAGNOSIS — E785 Hyperlipidemia, unspecified: Secondary | ICD-10-CM | POA: Diagnosis not present

## 2022-07-20 DIAGNOSIS — R319 Hematuria, unspecified: Secondary | ICD-10-CM | POA: Diagnosis not present

## 2022-07-28 DIAGNOSIS — E785 Hyperlipidemia, unspecified: Secondary | ICD-10-CM | POA: Diagnosis not present

## 2022-07-28 DIAGNOSIS — I10 Essential (primary) hypertension: Secondary | ICD-10-CM | POA: Diagnosis not present

## 2022-07-28 DIAGNOSIS — E6609 Other obesity due to excess calories: Secondary | ICD-10-CM | POA: Diagnosis not present

## 2022-07-28 DIAGNOSIS — D3500 Benign neoplasm of unspecified adrenal gland: Secondary | ICD-10-CM | POA: Diagnosis not present

## 2022-08-19 DIAGNOSIS — R109 Unspecified abdominal pain: Secondary | ICD-10-CM | POA: Diagnosis not present

## 2022-08-19 DIAGNOSIS — Z8673 Personal history of transient ischemic attack (TIA), and cerebral infarction without residual deficits: Secondary | ICD-10-CM | POA: Diagnosis not present

## 2022-08-19 DIAGNOSIS — Z90710 Acquired absence of both cervix and uterus: Secondary | ICD-10-CM | POA: Diagnosis not present

## 2022-08-19 DIAGNOSIS — I10 Essential (primary) hypertension: Secondary | ICD-10-CM | POA: Diagnosis not present

## 2022-08-19 DIAGNOSIS — R69 Illness, unspecified: Secondary | ICD-10-CM | POA: Diagnosis not present

## 2022-08-19 DIAGNOSIS — R1084 Generalized abdominal pain: Secondary | ICD-10-CM | POA: Diagnosis not present

## 2023-02-05 ENCOUNTER — Encounter: Payer: Self-pay | Admitting: Internal Medicine

## 2023-02-05 ENCOUNTER — Ambulatory Visit (INDEPENDENT_AMBULATORY_CARE_PROVIDER_SITE_OTHER): Payer: BLUE CROSS/BLUE SHIELD | Admitting: Internal Medicine

## 2023-02-05 VITALS — BP 104/72 | HR 67 | Ht 67.0 in | Wt 249.0 lb

## 2023-02-05 DIAGNOSIS — D3501 Benign neoplasm of right adrenal gland: Secondary | ICD-10-CM

## 2023-02-05 LAB — BASIC METABOLIC PANEL
BUN: 16 mg/dL (ref 6–23)
CO2: 27 mEq/L (ref 19–32)
Calcium: 9.6 mg/dL (ref 8.4–10.5)
Chloride: 101 mEq/L (ref 96–112)
Creatinine, Ser: 0.93 mg/dL (ref 0.40–1.20)
GFR: 67.82 mL/min (ref >60.00)
Glucose, Bld: 90 mg/dL (ref 70–99)
Potassium: 3.5 mEq/L (ref 3.5–5.1)
Sodium: 137 mEq/L (ref 135–145)

## 2023-02-05 NOTE — Progress Notes (Unsigned)
Name: Melissa Sparks  MRN/ DOB: [id number], 11-17-1966    Age/ Sex: 57 y.o., female    PCP: Perrin Maltese, MD   Reason for Endocrinology Evaluation: Right adrenal adenoma     Date of Initial Endocrinology Evaluation: 02/05/2023     HPI: Ms. Melissa Sparks is a 57 y.o. female with a past medical history of HTN, obesity, headaches, and arthralgias. The patient presented for initial endocrinology clinic visit on 02/05/2023 for consultative assistance with her right adrenal adenoma.   Patient was noted with an incidental finding of 1.6 cm right adrenal adenoma on CT imaging 07/20/2022 during  evaluation for back pain  and chronic hematuria    Substantial weight gain-no Easy bruisability- yes Severe hypertension- no DM- no Sudden/ severe headaches- stable  Weight loss-no Anxiety attacks-no Cardiac arrhythmias-no Palpitations-occasional  Fluid retention-no Hypokalemia- yes , she is on KCL   No FH of adrenal disorders but mother with DM    HISTORY:  Past Medical History:  Past Medical History:  Diagnosis Date   Anemia    Hypertension    Past Surgical History:  Past Surgical History:  Procedure Laterality Date   ABDOMINAL HYSTERECTOMY     ANKLE FUSION Right 07/07/2016   Procedure: ARTHRODESIS ANKLE;  Surgeon: Samara Deist, DPM;  Location: [facility];  Service: Podiatry;  Laterality: Right;   FLAT FOOT RECONSTRUCTION-TAL GASTROC RECESSION Right 07/07/2016   Procedure: FLAT FOOT RECONSTRUCTION-TAL GASTROC RECESSION;  Surgeon: Samara Deist, DPM;  Location: [facility];  Service: Podiatry;  Laterality: Right;   HYSTEROSCOPY WITH D & C  06/11/2015   Procedure: DILATATION AND CURETTAGE /HYSTEROSCOPY;  Surgeon: Honor Loh Ward, MD;  Location: [facility];  Service: Gynecology;;   LAPAROSCOPIC HYSTERECTOMY N/A 07/06/2015   Procedure: HYSTERECTOMY TOTAL LAPAROSCOPIC;  Surgeon: Honor Loh Ward, MD;  Location: [facility];  Service: Gynecology;  Laterality: N/A;   LAPAROSCOPIC SALPINGO OOPHERECTOMY  Bilateral 07/06/2015   Procedure: LAPAROSCOPIC SALPINGO OOPHORECTOMY;  Surgeon: Honor Loh Ward, MD;  Location: [facility];  Service: Gynecology;  Laterality: Bilateral;   SYNOVECTOMY  07/07/2016   Procedure: Posterior tibial tendon tenosynovectomy;  Surgeon: Samara Deist, DPM;  Location: [facility];  Service: Podiatry;;   TRIPLE SUBTALAR FUSION Right 07/07/2016   Procedure: TRIPLE SUBTALAR FUSION;  Surgeon: Samara Deist, DPM;  Location: [facility];  Service: Podiatry;  Laterality: Right;   TUBAL LIGATION  1987    Social History:  reports that she has been smoking cigarettes. She has a 60.00 pack-year smoking history. She has never used smokeless tobacco. She reports that she does not drink alcohol and does not use drugs. Family History: family history is not on file.   HOME MEDICATIONS: Allergies as of 02/05/2023       Reactions   Latex Rash   SENSITIVITY. Found out during gyn appt        Medication List        Accurate as of February 05, 2023 11:13 AM. If you have any questions, ask your nurse or doctor.          acetaminophen 325 MG tablet Commonly known as: TYLENOL Take 2 tablets (650 mg total) by mouth every 4 (four) hours.   famotidine 20 MG tablet Commonly known as: PEPCID Take 1 tablet (20 mg total) by mouth daily.   losartan-hydrochlorothiazide 100-25 MG tablet Commonly known as: HYZAAR Take 1 tablet by mouth daily.   metoprolol tartrate 50 MG tablet Commonly known as: LOPRESSOR Take 50 mg by mouth daily. Pt  takes once daily (Rx originally stated 1 tablet twice daily)   potassium chloride 10 MEQ tablet Commonly known as: KLOR-CON Take 1 tablet (10 mEq total) by mouth daily.          REVIEW OF SYSTEMS: A comprehensive ROS was conducted with the patient and is negative except as per HPI   OBJECTIVE:  VS: BP 104/72 (BP Location: Left Arm, Patient Position: Sitting)   Pulse 67   Ht '5\' 7"'$  (1.702 m)   Wt 249 lb (112.9 kg)   LMP 06/28/2015 Comment: upreg neg  SpO2  96%   BMI 39.00 kg/m    Wt Readings from Last 3 Encounters:  02/05/23 249 lb (112.9 kg)  02/23/19 250 lb (113.4 kg)  06/26/18 258 lb (117 kg)     EXAM: General: Pt appears well and is in NAD  Eyes: External eye exam normal without stare, lid lag or exophthalmos.  EOM intact.    Neck: General: Supple without adenopathy. Thyroid: Thyroid size normal.  No goiter or nodules appreciated. No thyroid bruit.  Lungs: Clear with good BS bilat with no rales, rhonchi, or wheezes  Heart: Auscultation: RRR.  Abdomen: Normoactive bowel sounds, soft, nontender, without masses or organomegaly palpable  Extremities:  BL LE: No pretibial edema normal ROM and strength.  Mental Status: Judgment, insight: Intact Orientation: Oriented to time, place, and person Mood and affect: No depression, anxiety, or agitation     DATA REVIEWED: ***    ASSESSMENT/PLAN/RECOMMENDATIONS:   Right Adrenal Adenoma      - Eighty five percent of adrenal adenomas are nonsecretory.  - Three forms of adrenal hyperfunction should be considered in patients with adrenal incidentaloma  Glucocorticoid hypersecretion Primary hyperaldosteronism Pheochromocytoma  Medications :  Signed electronically by: Mack Guise, MD  [facility]  [facility] [address], [address] [place], [place] [address] Phone: [phone] FAX: [phone]   CC: Perrin Maltese, [place] [address] Phone: [phone] Fax: [phone]   Return to Endocrinology clinic as below: No future appointments.

## 2023-02-05 NOTE — Patient Instructions (Signed)

## 2023-02-09 LAB — ALDOSTERONE + RENIN ACTIVITY W/ RATIO
Aldos/Renin Ratio: 1.4 (ref 0.0–30.0)
Aldosterone: 5.7 ng/dL (ref 0.0–30.0)
Renin Activity, Plasma: 3.946 ng/mL/h (ref 0.167–5.380)

## 2023-06-03 ENCOUNTER — Other Ambulatory Visit: Payer: Self-pay | Admitting: Nurse Practitioner

## 2023-07-09 ENCOUNTER — Ambulatory Visit (INDEPENDENT_AMBULATORY_CARE_PROVIDER_SITE_OTHER): Payer: BC Managed Care – PPO | Admitting: Cardiology

## 2023-07-09 ENCOUNTER — Encounter: Payer: Self-pay | Admitting: Cardiology

## 2023-07-09 VITALS — BP 118/86 | HR 72 | Ht 67.0 in | Wt 261.6 lb

## 2023-07-09 DIAGNOSIS — Z1231 Encounter for screening mammogram for malignant neoplasm of breast: Secondary | ICD-10-CM

## 2023-07-09 DIAGNOSIS — Z1211 Encounter for screening for malignant neoplasm of colon: Secondary | ICD-10-CM

## 2023-07-09 DIAGNOSIS — I10 Essential (primary) hypertension: Secondary | ICD-10-CM | POA: Diagnosis not present

## 2023-07-09 DIAGNOSIS — E782 Mixed hyperlipidemia: Secondary | ICD-10-CM

## 2023-07-09 DIAGNOSIS — F17210 Nicotine dependence, cigarettes, uncomplicated: Secondary | ICD-10-CM | POA: Diagnosis not present

## 2023-07-09 DIAGNOSIS — R5383 Other fatigue: Secondary | ICD-10-CM

## 2023-07-09 MED ORDER — NICOTINE 21 MG/24HR TD PT24: 21.0000 mg | MEDICATED_PATCH | TRANSDERMAL | 1 refills | Status: DC

## 2023-07-09 MED ORDER — LOSARTAN POTASSIUM-HCTZ 100-25 MG PO TABS: 1.0000 | ORAL_TABLET | Freq: Every day | ORAL | 1 refills | Status: DC

## 2023-07-09 MED ORDER — METOPROLOL TARTRATE 50 MG PO TABS: 50.0000 mg | ORAL_TABLET | Freq: Every day | ORAL | 1 refills | Status: DC

## 2023-07-09 MED ORDER — FAMOTIDINE 20 MG PO TABS: 20.0000 mg | ORAL_TABLET | Freq: Every day | ORAL | 1 refills | Status: DC

## 2023-07-09 NOTE — Progress Notes (Signed)
Established Patient Office Visit  Subjective:  Patient ID: Melissa Sparks, female    DOB: 03-17-1966  Age: 57 y.o. MRN: [id number]  Chief Complaint  Patient presents with   Follow-up    Medication refills    Patient in office for medication refills. Patient is not fasting today. Patient reports feeling tired today, woke up with a headache. Sleep study 05/2015, mild to severe obstructive sleep apnea. Patient not using a CPAP. Recommend repeating sleep study, patient declines.  No history of mammogram, order placed despite patient verbalizing she may not do it.  No history of colonoscopy or Cologuard. Order placed despite patient verbalizing she may not do it. Will return another day for fasting labs. Patient wanting to discuss weight loss medication, will return after blood work to discuss. Patient continues to smoke, has quit in the past using nicotine patches. Will send in refill for patches.     No other concerns at this time.   Past Medical History:  Diagnosis Date   Anemia    Chest pain 06/25/2018   Hypertension     Past Surgical History:  Procedure Laterality Date   ABDOMINAL HYSTERECTOMY     ANKLE FUSION Right 07/07/2016   Procedure: ARTHRODESIS ANKLE;  Surgeon: Gwyneth Revels, DPM;  Location: [facility];  Service: Podiatry;  Laterality: Right;   FLAT FOOT RECONSTRUCTION-TAL GASTROC RECESSION Right 07/07/2016   Procedure: FLAT FOOT RECONSTRUCTION-TAL GASTROC RECESSION;  Surgeon: Gwyneth Revels, DPM;  Location: [facility];  Service: Podiatry;  Laterality: Right;   HYSTEROSCOPY WITH D & C  06/11/2015   Procedure: DILATATION AND CURETTAGE /HYSTEROSCOPY;  Surgeon: Elenora Fender Ward, MD;  Location: [facility];  Service: Gynecology;;   LAPAROSCOPIC HYSTERECTOMY N/A 07/06/2015   Procedure: HYSTERECTOMY TOTAL LAPAROSCOPIC;  Surgeon: Elenora Fender Ward, MD;  Location: [facility];  Service: Gynecology;  Laterality: N/A;   LAPAROSCOPIC SALPINGO OOPHERECTOMY Bilateral 07/06/2015   Procedure: LAPAROSCOPIC  SALPINGO OOPHORECTOMY;  Surgeon: Elenora Fender Ward, MD;  Location: [facility];  Service: Gynecology;  Laterality: Bilateral;   SYNOVECTOMY  07/07/2016   Procedure: Posterior tibial tendon tenosynovectomy;  Surgeon: Gwyneth Revels, DPM;  Location: [facility];  Service: Podiatry;;   TRIPLE SUBTALAR FUSION Right 07/07/2016   Procedure: TRIPLE SUBTALAR FUSION;  Surgeon: Gwyneth Revels, DPM;  Location: [facility];  Service: Podiatry;  Laterality: Right;   TUBAL LIGATION  1987    Social History   Socioeconomic History   Marital status: Single    Spouse name: Not on file   Number of children: Not on file   Years of education: Not on file   Highest education level: Not on file  Occupational History   Not on file  Tobacco Use   Smoking status: Some Days    Current packs/day: 0.00    Average packs/day: 2.0 packs/day for 30.0 years (60.0 ttl pk-yrs)    Types: Cigarettes    Start date: 03/08/1985    Last attempt to quit: 03/09/2015    Years since quitting: 8.3   Smokeless tobacco: Never  Substance and Sexual Activity   Alcohol use: No   Drug use: No   Sexual activity: Not Currently  Other Topics Concern   Not on file  Social History Narrative   Not on file   Social Determinants of Health   Financial Resource Strain: Not on file  Food Insecurity: Not on file  Transportation Needs: Not on file  Physical Activity: Not on file  Stress: Not on file  Social Connections: Unknown (04/17/2022)  Received from [facility]   Social Network    Social Network: Not on file  Intimate Partner Violence: Unknown (03/09/2022)   Received from [facility]   HITS    Physically Hurt: Not on file    Insult or Talk Down To: Not on file    Threaten Physical Harm: Not on file    Scream or Curse: Not on file    No family history on file.  Allergies  Allergen Reactions   Latex Rash    SENSITIVITY. Found out during gyn appt    Review of Systems  Constitutional: Negative.   HENT: Negative.    Eyes: Negative.    Respiratory: Negative.  Negative for shortness of breath.   Cardiovascular: Negative.  Negative for chest pain.  Gastrointestinal: Negative.  Negative for abdominal pain, constipation and diarrhea.  Genitourinary: Negative.   Musculoskeletal:  Negative for joint pain and myalgias.  Skin: Negative.   Neurological: Negative.  Negative for dizziness and headaches.  Endo/Heme/Allergies: Negative.   All other systems reviewed and are negative.      Objective:   BP 118/86   Pulse 72   Ht 5\' 7"  (1.702 m)   Wt 261 lb 9.6 oz (118.7 kg)   LMP 06/28/2015 Comment: upreg neg  SpO2 98%   BMI 40.97 kg/m   Vitals:   07/09/23 1027  BP: 118/86  Pulse: 72  Height: 5\' 7"  (1.702 m)  Weight: 261 lb 9.6 oz (118.7 kg)  SpO2: 98%  BMI (Calculated): 40.96    Physical Exam Vitals and nursing note reviewed.  Constitutional:      Appearance: Normal appearance. She is normal weight.  HENT:     Head: Normocephalic and atraumatic.     Nose: Nose normal.     Mouth/Throat:     Mouth: Mucous membranes are moist.  Eyes:     Extraocular Movements: Extraocular movements intact.     Conjunctiva/sclera: Conjunctivae normal.     Pupils: Pupils are equal, round, and reactive to light.  Cardiovascular:     Rate and Rhythm: Normal rate and regular rhythm.     Pulses: Normal pulses.     Heart sounds: Normal heart sounds.  Pulmonary:     Effort: Pulmonary effort is normal.     Breath sounds: Normal breath sounds.  Abdominal:     General: Abdomen is flat. Bowel sounds are normal.     Palpations: Abdomen is soft.  Musculoskeletal:        General: Normal range of motion.     Cervical back: Normal range of motion.  Skin:    General: Skin is warm and dry.  Neurological:     General: No focal deficit present.     Mental Status: She is alert and oriented to person, place, and time.  Psychiatric:        Mood and Affect: Mood normal.        Behavior: Behavior normal.        Thought Content: Thought  content normal.        Judgment: Judgment normal.      No results found for any visits on 07/09/23.  No results found for this or any previous visit (from the past 2160 hour(s)).    Assessment & Plan:  Recommend sleep study, patient declines. Cologuard cancer screening. Mammogram order sent. Return for fasting lab work. Nicotine patches for smoking cessation.  Problem List Items Addressed This Visit       Cardiovascular and Mediastinum  Primary hypertension   Relevant Medications   losartan-hydrochlorothiazide (HYZAAR) 100-25 MG tablet   metoprolol tartrate (LOPRESSOR) 50 MG tablet   Other Relevant Orders   CBC with Diff   CMP14+EGFR     Other   Mixed hyperlipidemia - Primary   Relevant Medications   losartan-hydrochlorothiazide (HYZAAR) 100-25 MG tablet   metoprolol tartrate (LOPRESSOR) 50 MG tablet   Other Relevant Orders   CBC with Diff   Hemoglobin A1c   Lipid Profile   Cigarette smoker   Other Visit Diagnoses     Encounter for screening mammogram for malignant neoplasm of breast       Relevant Orders   MM 3D SCREENING MAMMOGRAM BILATERAL BREAST   Colon cancer screening       Relevant Orders   Cologuard   Other fatigue       Relevant Orders   CBC with Diff   Hemoglobin A1c   TSH       Return in about 4 weeks (around 08/06/2023) for after fasting labs.   Total time spent: 30 minutes  Google, NP  07/09/2023   This document may have been prepared by Dragon Voice Recognition software and as such may include unintentional dictation errors.

## 2023-07-16 ENCOUNTER — Ambulatory Visit: Payer: BC Managed Care – PPO | Admitting: Internal Medicine

## 2023-08-07 ENCOUNTER — Ambulatory Visit: Payer: BC Managed Care – PPO | Admitting: Cardiology

## 2023-10-11 ENCOUNTER — Other Ambulatory Visit: Payer: BC Managed Care – PPO

## 2023-10-12 ENCOUNTER — Encounter: Payer: Self-pay | Admitting: Cardiology

## 2023-10-12 ENCOUNTER — Ambulatory Visit (INDEPENDENT_AMBULATORY_CARE_PROVIDER_SITE_OTHER): Payer: BC Managed Care – PPO | Admitting: Cardiology

## 2023-10-12 VITALS — BP 110/78 | HR 75 | Ht 67.0 in | Wt 253.0 lb

## 2023-10-12 DIAGNOSIS — I10 Essential (primary) hypertension: Secondary | ICD-10-CM | POA: Diagnosis not present

## 2023-10-12 DIAGNOSIS — E782 Mixed hyperlipidemia: Secondary | ICD-10-CM

## 2023-10-12 LAB — CBC WITH DIFFERENTIAL/PLATELET
Basophils Absolute: 0 10*3/uL (ref 0.0–0.2)
Basos: 1 %
EOS (ABSOLUTE): 0.2 10*3/uL (ref 0.0–0.4)
Eos: 4 %
Hematocrit: 35.2 % (ref 34.0–46.6)
Hemoglobin: 11.5 g/dL (ref 11.1–15.9)
Immature Grans (Abs): 0 10*3/uL (ref 0.0–0.1)
Immature Granulocytes: 0 %
Lymphocytes Absolute: 1.8 10*3/uL (ref 0.7–3.1)
Lymphs: 42 %
MCH: 28.6 pg (ref 26.6–33.0)
MCHC: 32.7 g/dL (ref 31.5–35.7)
MCV: 88 fL (ref 79–97)
Monocytes Absolute: 0.4 10*3/uL (ref 0.1–0.9)
Monocytes: 9 %
Neutrophils Absolute: 1.9 10*3/uL (ref 1.4–7.0)
Neutrophils: 44 %
Platelets: 250 10*3/uL (ref 150–450)
RBC: 4.02 x10E6/uL (ref 3.77–5.28)
RDW: 13.8 % (ref 11.7–15.4)
WBC: 4.4 10*3/uL (ref 3.4–10.8)

## 2023-10-12 LAB — LIPID PANEL
Chol/HDL Ratio: 3.8 {ratio} (ref 0.0–4.4)
Cholesterol, Total: 207 mg/dL — ABNORMAL HIGH (ref 100–199)
HDL: 55 mg/dL (ref >39)
LDL Chol Calc (NIH): 137 mg/dL — ABNORMAL HIGH (ref 0–99)
Triglycerides: 86 mg/dL (ref 0–149)
VLDL Cholesterol Cal: 15 mg/dL (ref 5–40)

## 2023-10-12 LAB — CMP14+EGFR
ALT: 10 [IU]/L (ref 0–32)
AST: 12 [IU]/L (ref 0–40)
Albumin: 3.8 g/dL (ref 3.8–4.9)
Alkaline Phosphatase: 120 [IU]/L (ref 44–121)
BUN/Creatinine Ratio: 27 — ABNORMAL HIGH (ref 9–23)
BUN: 28 mg/dL — ABNORMAL HIGH (ref 6–24)
Bilirubin Total: 0.3 mg/dL (ref 0.0–1.2)
CO2: 21 mmol/L (ref 20–29)
Calcium: 9.1 mg/dL (ref 8.7–10.2)
Chloride: 104 mmol/L (ref 96–106)
Creatinine, Ser: 1.02 mg/dL — ABNORMAL HIGH (ref 0.57–1.00)
Globulin, Total: 2.8 g/dL (ref 1.5–4.5)
Glucose: 95 mg/dL (ref 70–99)
Potassium: 3.7 mmol/L (ref 3.5–5.2)
Sodium: 139 mmol/L (ref 134–144)
Total Protein: 6.6 g/dL (ref 6.0–8.5)
eGFR: 63 mL/min/{1.73_m2} (ref >59)

## 2023-10-12 LAB — HEMOGLOBIN A1C
Est. average glucose Bld gHb Est-mCnc: 128 mg/dL
Hgb A1c MFr Bld: 6.1 % — ABNORMAL HIGH (ref 4.8–5.6)

## 2023-10-12 LAB — TSH: TSH: 2.25 u[IU]/mL (ref 0.450–4.500)

## 2023-10-12 MED ORDER — LOSARTAN POTASSIUM 50 MG PO TABS: 50.0000 mg | ORAL_TABLET | Freq: Every morning | ORAL | 3 refills | Status: DC

## 2023-10-12 MED ORDER — IBUPROFEN 800 MG PO TABS: 800.0000 mg | ORAL_TABLET | Freq: Three times a day (TID) | ORAL | 3 refills | Status: DC | PRN

## 2023-10-12 MED ORDER — HYDROCHLOROTHIAZIDE 12.5 MG PO TABS: 12.5000 mg | ORAL_TABLET | Freq: Every morning | ORAL | 3 refills | Status: DC

## 2023-10-12 MED ORDER — METOPROLOL TARTRATE 50 MG PO TABS: 50.0000 mg | ORAL_TABLET | Freq: Every day | ORAL | 1 refills | Status: DC

## 2023-10-12 MED ORDER — WEGOVY 0.25 MG/0.5ML ~~LOC~~ SOAJ: 0.2500 mg | SUBCUTANEOUS | 3 refills | Status: DC

## 2023-10-12 MED ORDER — ROSUVASTATIN CALCIUM 10 MG PO TABS: 10.0000 mg | ORAL_TABLET | Freq: Every day | ORAL | 11 refills | Status: DC

## 2023-10-12 NOTE — Progress Notes (Signed)
Established Patient Office Visit  Subjective:  Patient ID: Melissa Sparks, female    DOB: 1965/12/14  Age: 57 y.o. MRN: [id number]  Chief Complaint  Patient presents with   Follow-up    3 months lab results and discuss meds     Patient in office for 3 month follow up, discuss recent lab results. Patient reports feeling well. No acute complaints today.  Discussed recent lab work. Patient has not been taking rosuvastatin, will send in a refill. Hgb A1c elevated, pre-diabetic. Discussed decreasing sugar and starch intake. Patient interested in an injectable weight loss medications. Will send in [facility].  Creat elevated, increase water intake.     No other concerns at this time.   Past Medical History:  Diagnosis Date   Anemia    Chest pain 06/25/2018   Hypertension     Past Surgical History:  Procedure Laterality Date   ABDOMINAL HYSTERECTOMY     ANKLE FUSION Right 07/07/2016   Procedure: ARTHRODESIS ANKLE;  Surgeon: Gwyneth Revels, DPM;  Location: [facility];  Service: Podiatry;  Laterality: Right;   FLAT FOOT RECONSTRUCTION-TAL GASTROC RECESSION Right 07/07/2016   Procedure: FLAT FOOT RECONSTRUCTION-TAL GASTROC RECESSION;  Surgeon: Gwyneth Revels, DPM;  Location: [facility];  Service: Podiatry;  Laterality: Right;   HYSTEROSCOPY WITH D & C  06/11/2015   Procedure: DILATATION AND CURETTAGE /HYSTEROSCOPY;  Surgeon: Elenora Fender Ward, MD;  Location: [facility];  Service: Gynecology;;   LAPAROSCOPIC HYSTERECTOMY N/A 07/06/2015   Procedure: HYSTERECTOMY TOTAL LAPAROSCOPIC;  Surgeon: Elenora Fender Ward, MD;  Location: [facility];  Service: Gynecology;  Laterality: N/A;   LAPAROSCOPIC SALPINGO OOPHERECTOMY Bilateral 07/06/2015   Procedure: LAPAROSCOPIC SALPINGO OOPHORECTOMY;  Surgeon: Elenora Fender Ward, MD;  Location: [facility];  Service: Gynecology;  Laterality: Bilateral;   SYNOVECTOMY  07/07/2016   Procedure: Posterior tibial tendon tenosynovectomy;  Surgeon: Gwyneth Revels, DPM;  Location: [facility];  Service:  Podiatry;;   TRIPLE SUBTALAR FUSION Right 07/07/2016   Procedure: TRIPLE SUBTALAR FUSION;  Surgeon: Gwyneth Revels, DPM;  Location: [facility];  Service: Podiatry;  Laterality: Right;   TUBAL LIGATION  1987    Social History   Socioeconomic History   Marital status: Single    Spouse name: Not on file   Number of children: Not on file   Years of education: Not on file   Highest education level: Not on file  Occupational History   Not on file  Tobacco Use   Smoking status: Some Days    Current packs/day: 0.00    Average packs/day: 2.0 packs/day for 30.0 years (60.0 ttl pk-yrs)    Types: Cigarettes    Start date: 03/08/1985    Last attempt to quit: 03/09/2015    Years since quitting: 8.6   Smokeless tobacco: Never  Substance and Sexual Activity   Alcohol use: No   Drug use: No   Sexual activity: Not Currently  Other Topics Concern   Not on file  Social History Narrative   Not on file   Social Determinants of Health   Financial Resource Strain: Not on file  Food Insecurity: Not on file  Transportation Needs: Not on file  Physical Activity: Not on file  Stress: Not on file  Social Connections: Unknown (04/17/2022)   Received [facility]   Social Network    Social Network: Not on file  Intimate Partner Violence: Unknown (03/09/2022)   Received from [facility]   HITS    Physically Hurt: Not  on file    Insult or Talk Down To: Not on file    Threaten Physical Harm: Not on file    Scream or Curse: Not on file    History reviewed. No pertinent family history.  Allergies  Allergen Reactions   Latex Rash    SENSITIVITY. Found out during gyn appt    Review of Systems  Constitutional: Negative.   HENT: Negative.    Eyes: Negative.   Respiratory: Negative.  Negative for shortness of breath.   Cardiovascular: Negative.  Negative for chest pain.  Gastrointestinal: Negative.  Negative for abdominal pain, constipation and diarrhea.   Genitourinary: Negative.   Musculoskeletal:  Negative for joint pain and myalgias.  Skin: Negative.   Neurological: Negative.  Negative for dizziness and headaches.  Endo/Heme/Allergies: Negative.   All other systems reviewed and are negative.      Objective:   BP 110/78   Pulse 75   Ht 5\' 7"  (1.702 m)   Wt 253 lb (114.8 kg)   LMP 06/28/2015 Comment: upreg neg  SpO2 97%   BMI 39.63 kg/m   Vitals:   10/12/23 0932  BP: 110/78  Pulse: 75  Height: 5\' 7"  (1.702 m)  Weight: 253 lb (114.8 kg)  SpO2: 97%  BMI (Calculated): 39.62    Physical Exam Vitals and nursing note reviewed.  Constitutional:      Appearance: Normal appearance. She is normal weight.  HENT:     Head: Normocephalic and atraumatic.     Nose: Nose normal.     Mouth/Throat:     Mouth: Mucous membranes are moist.  Eyes:     Extraocular Movements: Extraocular movements intact.     Conjunctiva/sclera: Conjunctivae normal.     Pupils: Pupils are equal, round, and reactive to light.  Cardiovascular:     Rate and Rhythm: Normal rate and regular rhythm.     Pulses: Normal pulses.     Heart sounds: Normal heart sounds.  Pulmonary:     Effort: Pulmonary effort is normal.     Breath sounds: Normal breath sounds.  Abdominal:     General: Abdomen is flat. Bowel sounds are normal.     Palpations: Abdomen is soft.  Musculoskeletal:        General: Normal range of motion.     Cervical back: Normal range of motion.  Skin:    General: Skin is warm and dry.  Neurological:     General: No focal deficit present.     Mental Status: She is alert and oriented to person, place, and time.  Psychiatric:        Mood and Affect: Mood normal.        Behavior: Behavior normal.        Thought Content: Thought content normal.        Judgment: Judgment normal.      No results found for any visits on 10/12/23.  Recent Results (from the past 2160 hour(s))  CBC with Diff     Status: None   Collection Time: 10/11/23   8:46 AM  Result Value Ref Range   WBC 4.4 3.4 - 10.8 x10E3/uL   RBC 4.02 3.77 - 5.28 x10E6/uL   Hemoglobin 11.5 11.1 - 15.9 g/dL   Hematocrit 16.1 09.6 - 46.6 %   MCV 88 79 - 97 fL   MCH 28.6 26.6 - 33.0 pg   MCHC 32.7 31.5 - 35.7 g/dL   RDW 04.5 40.9 - 81.1 %   Platelets 250 150 -  450 x10E3/uL   Neutrophils 44 Not Estab. %   Lymphs 42 Not Estab. %   Monocytes 9 Not Estab. %   Eos 4 Not Estab. %   Basos 1 Not Estab. %   Neutrophils Absolute 1.9 1.4 - 7.0 x10E3/uL   Lymphocytes Absolute 1.8 0.7 - 3.1 x10E3/uL   Monocytes Absolute 0.4 0.1 - 0.9 x10E3/uL   EOS (ABSOLUTE) 0.2 0.0 - 0.4 x10E3/uL   Basophils Absolute 0.0 0.0 - 0.2 x10E3/uL   Immature Granulocytes 0 Not Estab. %   Immature Grans (Abs) 0.0 0.0 - 0.1 x10E3/uL  CMP14+EGFR     Status: Abnormal   Collection Time: 10/11/23  8:46 AM  Result Value Ref Range   Glucose 95 70 - 99 mg/dL   BUN 28 (H) 6 - 24 mg/dL   Creatinine, Ser 2.72 (H) 0.57 - 1.00 mg/dL   eGFR 63 >53 GU/YQI/3.47   BUN/Creatinine Ratio 27 (H) 9 - 23   Sodium 139 134 - 144 mmol/L   Potassium 3.7 3.5 - 5.2 mmol/L   Chloride 104 96 - 106 mmol/L   CO2 21 20 - 29 mmol/L   Calcium 9.1 8.7 - 10.2 mg/dL   Total Protein 6.6 6.0 - 8.5 g/dL   Albumin 3.8 3.8 - 4.9 g/dL   Globulin, Total 2.8 1.5 - 4.5 g/dL   Bilirubin Total 0.3 0.0 - 1.2 mg/dL   Alkaline Phosphatase 120 44 - 121 IU/L   AST 12 0 - 40 IU/L   ALT 10 0 - 32 IU/L  Hemoglobin A1c     Status: Abnormal   Collection Time: 10/11/23  8:46 AM  Result Value Ref Range   Hgb A1c MFr Bld 6.1 (H) 4.8 - 5.6 %    Comment:          Prediabetes: 5.7 - 6.4          Diabetes: >6.4          Glycemic control for adults with diabetes: <7.0    Est. average glucose Bld gHb Est-mCnc 128 mg/dL  Lipid Profile     Status: Abnormal   Collection Time: 10/11/23  8:46 AM  Result Value Ref Range   Cholesterol, Total 207 (H) 100 - 199 mg/dL   Triglycerides 86 0 - 149 mg/dL   HDL 55 >42 mg/dL   VLDL Cholesterol Cal 15 5 - 40  mg/dL   LDL Chol Calc (NIH) 595 (H) 0 - 99 mg/dL   Chol/HDL Ratio 3.8 0.0 - 4.4 ratio    Comment:                                   T. Chol/HDL Ratio                                             Men  Women                               1/2 Avg.Risk  3.4    3.3                                   Avg.Risk  5.0    4.4  2X Avg.Risk  9.6    7.1                                3X Avg.Risk 23.4   11.0   TSH     Status: None   Collection Time: 10/11/23  8:46 AM  Result Value Ref Range   TSH 2.250 0.450 - 4.500 uIU/mL      Assessment & Plan:  Increase water intake.  Follow a strict diabetic diet and exercise.  Wegovy for weight loss.   Problem List Items Addressed This Visit       Cardiovascular and Mediastinum   Primary hypertension - Primary   Relevant Medications   rosuvastatin (CRESTOR) 10 MG tablet   hydrochlorothiazide (HYDRODIURIL) 12.5 MG tablet   losartan (COZAAR) 50 MG tablet   metoprolol tartrate (LOPRESSOR) 50 MG tablet     Other   Mixed hyperlipidemia   Relevant Medications   rosuvastatin (CRESTOR) 10 MG tablet   hydrochlorothiazide (HYDRODIURIL) 12.5 MG tablet   losartan (COZAAR) 50 MG tablet   metoprolol tartrate (LOPRESSOR) 50 MG tablet    Return in about 6 weeks (around 11/23/2023).   Total time spent: 25 minutes  Google, NP  10/12/2023   This document may have been prepared by Dragon Voice Recognition software and as such may include unintentional dictation errors.

## 2023-10-23 ENCOUNTER — Telehealth: Payer: Self-pay | Admitting: Cardiology

## 2023-10-23 NOTE — Telephone Encounter (Signed)
Patient left VM that she has been unable to get her diabetes medication. Need to call and see what the issue is and why she has been unable to get them.

## 2024-02-05 ENCOUNTER — Ambulatory Visit: Payer: BLUE CROSS/BLUE SHIELD | Admitting: Internal Medicine

## 2024-02-05 NOTE — Progress Notes (Deleted)
 Name: Melissa Sparks  MRN/ DOB: [id number], [birth year]    Age/ Sex: 57 y.o., female    PCP: Marisue Ivan, NP   Reason for Endocrinology Evaluation: Right adrenal adenoma     Date of Initial Endocrinology Evaluation: 02/05/2023    HPI: Ms. Melissa Sparks is a 58 y.o. female with a past medical history of HTN, obesity, headaches, and arthralgias. The patient presented for initial endocrinology clinic visit on 02/05/2023 for consultative assistance with her right adrenal adenoma.   Patient was noted with an incidental finding of 1.6 cm right adrenal adenoma on CT imaging 07/20/2022 during  evaluation for back pain  and chronic hematuria    No family history of adrenal disorders, mother with DM   On her initial visit to our clinic she had a normal aldosterone 5.7 NG/DL, Aldo/renin ratio 1.4, renin 3.946 NG/mL/HR  She did not submit 24-hour urine for cortisol and catecholamines in 2024  SUBJECTIVE:    Today (02/05/24):  Melissa Sparks is here for follow-up on adrenal adenoma.   Substantial weight gain-no Easy bruisability- yes Severe hypertension- no DM- no Sudden/ severe headaches- stable  Weight loss-no Anxiety attacks-no Cardiac arrhythmias-no Palpitations-occasional  Fluid retention-no Hypokalemia- yes , she is on KCL      HISTORY:  Past Medical History:  Past Medical History:  Diagnosis Date   Anemia    Chest pain 06/25/2018   Hypertension    Past Surgical History:  Past Surgical History:  Procedure Laterality Date   ABDOMINAL HYSTERECTOMY     ANKLE FUSION Right 07/07/2016   Procedure: ARTHRODESIS ANKLE;  Surgeon: Gwyneth Revels, DPM;  Location: [facility];  Service: Podiatry;  Laterality: Right;   FLAT FOOT RECONSTRUCTION-TAL GASTROC RECESSION Right 07/07/2016   Procedure: FLAT FOOT RECONSTRUCTION-TAL GASTROC RECESSION;  Surgeon: Gwyneth Revels, DPM;  Location: [facility];  Service: Podiatry;  Laterality: Right;   HYSTEROSCOPY WITH D & C  06/11/2015    Procedure: DILATATION AND CURETTAGE /HYSTEROSCOPY;  Surgeon: Elenora Fender Ward, MD;  Location: [facility];  Service: Gynecology;;   LAPAROSCOPIC HYSTERECTOMY N/A 07/06/2015   Procedure: HYSTERECTOMY TOTAL LAPAROSCOPIC;  Surgeon: Elenora Fender Ward, MD;  Location: [facility];  Service: Gynecology;  Laterality: N/A;   LAPAROSCOPIC SALPINGO OOPHERECTOMY Bilateral 07/06/2015   Procedure: LAPAROSCOPIC SALPINGO OOPHORECTOMY;  Surgeon: Elenora Fender Ward, MD;  Location: [facility];  Service: Gynecology;  Laterality: Bilateral;   SYNOVECTOMY  07/07/2016   Procedure: Posterior tibial tendon tenosynovectomy;  Surgeon: Gwyneth Revels, DPM;  Location: [facility];  Service: Podiatry;;   TRIPLE SUBTALAR FUSION Right 07/07/2016   Procedure: TRIPLE SUBTALAR FUSION;  Surgeon: Gwyneth Revels, DPM;  Location: [facility];  Service: Podiatry;  Laterality: Right;   TUBAL LIGATION  1987    Social History:  reports that she has been smoking cigarettes. She started smoking about 38 years ago. She has a 60 pack-year smoking history. She has never used smokeless tobacco. She reports that she does not drink alcohol and does not use drugs. Family History: family history is not on file.   HOME MEDICATIONS: Allergies as of 02/05/2024       Reactions   Latex Rash   SENSITIVITY. Found out during gyn appt        Medication List        Accurate as of February 05, 2024  6:53 AM. If you have any questions, ask your nurse or doctor.          famotidine 20 MG tablet Commonly known as:  PEPCID Take 1 tablet (20 mg total) by mouth daily.   hydrochlorothiazide 12.5 MG tablet Commonly known as: HYDRODIURIL Take 1 tablet (12.5 mg total) by mouth every morning.   ibuprofen 800 MG tablet Commonly known as: ADVIL Take 1 tablet (800 mg total) by mouth every 8 (eight) hours as needed.   losartan 50 MG tablet Commonly known as: COZAAR Take 1 tablet (50 mg total) by mouth every morning.   metoprolol tartrate 50 MG tablet Commonly known as:  LOPRESSOR Take 1 tablet (50 mg total) by mouth daily.   nicotine 21 mg/24hr patch Commonly known as: NICODERM CQ - dosed in mg/24 hours Place 1 patch (21 mg total) onto the skin daily.   rosuvastatin 10 MG tablet Commonly known as: Crestor Take 1 tablet (10 mg total) by mouth daily.   Wegovy 0.25 MG/0.5ML Soaj Generic drug: Semaglutide-Weight Management Inject 0.25 mg into the skin once a week.          REVIEW OF SYSTEMS: A comprehensive ROS was conducted with the patient and is negative except as per HPI   OBJECTIVE:  VS: LMP 06/28/2015 Comment: upreg neg   Wt Readings from Last 3 Encounters:  10/12/23 253 lb (114.8 kg)  07/09/23 261 lb 9.6 oz (118.7 kg)  02/05/23 249 lb (112.9 kg)     EXAM: General: Pt appears well and is in NAD  Eyes: External eye exam normal without stare, lid lag or exophthalmos.  EOM intact.    Neck: General: Supple without adenopathy. Thyroid: Thyroid size normal.  No goiter or nodules appreciated. No thyroid bruit.  Lungs: Clear with good BS bilat with no rales, rhonchi, or wheezes  Heart: Auscultation: RRR.  Abdomen: Normoactive bowel sounds, soft, nontender, without masses or organomegaly palpable  Extremities:  BL LE: No pretibial edema normal ROM and strength.  Mental Status: Judgment, insight: Intact Orientation: Oriented to time, place, and person Mood and affect: No depression, anxiety, or agitation     DATA REVIEWED:  Latest Reference Range & Units 10/11/23 08:46  Sodium 134 - 144 mmol/L 139  Potassium 3.5 - 5.2 mmol/L 3.7  Chloride 96 - 106 mmol/L 104  CO2 20 - 29 mmol/L 21  Glucose 70 - 99 mg/dL 95  BUN 6 - 24 mg/dL 28 (H)  Creatinine 0.86 - 1.00 mg/dL 5.78 (H)  Calcium 8.7 - 10.2 mg/dL 9.1  BUN/Creatinine Ratio 9 - 23  27 (H)  eGFR >59 mL/min/1.73 63  Alkaline Phosphatase 44 - 121 IU/L 120  Albumin 3.8 - 4.9 g/dL 3.8  AST 0 - 40 IU/L 12  ALT 0 - 32 IU/L 10  Total Protein 6.0 - 8.5 g/dL 6.6  Total Bilirubin 0.0 - 1.2  mg/dL 0.3  (H): Data is abnormally high     ASSESSMENT/PLAN/RECOMMENDATIONS:   Right Adrenal Adenoma   -Aldo, renin were normal in 2024.  She did not submit 24-hour urine collection for cortisol and catecholamines  Follow-up in 1 year Signed electronically by: Lyndle Herrlich, MD  [facility] Endocrinology  [facility] [address] [place], [place] [address] Phone: [phone] FAX: [phone]   CC: Marisue Ivan, NP [address] [place] [address] Phone: [phone] Fax: [phone]   Return to Endocrinology clinic as below: Future Appointments  Date Time Provider Department Center  02/05/2024 10:10 AM Melissa Sparks, Konrad Dolores, MD [id number] None

## 2024-02-12 ENCOUNTER — Other Ambulatory Visit

## 2024-02-12 DIAGNOSIS — I10 Essential (primary) hypertension: Secondary | ICD-10-CM

## 2024-02-12 DIAGNOSIS — E669 Obesity, unspecified: Secondary | ICD-10-CM

## 2024-02-12 DIAGNOSIS — R7301 Impaired fasting glucose: Secondary | ICD-10-CM

## 2024-02-12 DIAGNOSIS — E782 Mixed hyperlipidemia: Secondary | ICD-10-CM

## 2024-02-13 LAB — TSH: TSH: 1.54 u[IU]/mL (ref 0.450–4.500)

## 2024-02-13 LAB — CMP14+EGFR
ALT: 18 IU/L (ref 0–32)
AST: 13 IU/L (ref 0–40)
Albumin: 4.2 g/dL (ref 3.8–4.9)
Alkaline Phosphatase: 149 IU/L — ABNORMAL HIGH (ref 44–121)
BUN/Creatinine Ratio: 21 (ref 9–23)
BUN: 19 mg/dL (ref 6–24)
Bilirubin Total: 0.4 mg/dL (ref 0.0–1.2)
CO2: 23 mmol/L (ref 20–29)
Calcium: 9.7 mg/dL (ref 8.7–10.2)
Chloride: 102 mmol/L (ref 96–106)
Creatinine, Ser: 0.91 mg/dL (ref 0.57–1.00)
Globulin, Total: 2.7 g/dL (ref 1.5–4.5)
Glucose: 97 mg/dL (ref 70–99)
Potassium: 4.1 mmol/L (ref 3.5–5.2)
Sodium: 144 mmol/L (ref 134–144)
Total Protein: 6.9 g/dL (ref 6.0–8.5)
eGFR: 73 mL/min/{1.73_m2} (ref >59)

## 2024-02-13 LAB — LIPID PANEL
Chol/HDL Ratio: 2.2 ratio (ref 0.0–4.4)
Cholesterol, Total: 131 mg/dL (ref 100–199)
HDL: 59 mg/dL (ref >39)
LDL Chol Calc (NIH): 58 mg/dL (ref 0–99)
Triglycerides: 67 mg/dL (ref 0–149)
VLDL Cholesterol Cal: 14 mg/dL (ref 5–40)

## 2024-02-13 LAB — HEMOGLOBIN A1C
Est. average glucose Bld gHb Est-mCnc: 123 mg/dL
Hgb A1c MFr Bld: 5.9 % — ABNORMAL HIGH (ref 4.8–5.6)

## 2024-02-14 ENCOUNTER — Other Ambulatory Visit: Payer: Self-pay | Admitting: Cardiology

## 2024-03-07 ENCOUNTER — Ambulatory Visit: Admitting: Cardiology

## 2024-03-07 ENCOUNTER — Ambulatory Visit
Admission: RE | Admit: 2024-03-07 | Discharge: 2024-03-07 | Disposition: A | Discharge status: Home / Self Care | Source: Ambulatory Visit | Attending: Cardiology | Admitting: Cardiology

## 2024-03-07 ENCOUNTER — Encounter: Payer: Self-pay | Admitting: Cardiology

## 2024-03-07 ENCOUNTER — Ambulatory Visit
Admission: RE | Admit: 2024-03-07 | Discharge: 2024-03-07 | Disposition: A | Discharge status: Home / Self Care | Attending: Cardiology | Admitting: Cardiology

## 2024-03-07 VITALS — BP 108/68 | HR 71 | Ht 67.0 in | Wt 253.0 lb

## 2024-03-07 DIAGNOSIS — M25561 Pain in right knee: Secondary | ICD-10-CM

## 2024-03-07 DIAGNOSIS — Z0130 Encounter for examination of blood pressure without abnormal findings: Secondary | ICD-10-CM

## 2024-03-07 DIAGNOSIS — R7303 Prediabetes: Secondary | ICD-10-CM

## 2024-03-07 MED ORDER — FAMOTIDINE 20 MG PO TABS: 20.0000 mg | ORAL_TABLET | Freq: Every day | ORAL | 1 refills | Status: DC

## 2024-03-07 MED ORDER — IBUPROFEN 800 MG PO TABS: 800.0000 mg | ORAL_TABLET | Freq: Three times a day (TID) | ORAL | 0 refills | Status: DC | PRN

## 2024-03-07 NOTE — Progress Notes (Signed)
 Established Patient Office Visit  Subjective:  Patient ID: Melissa Sparks, female    DOB: 06-26-1966  Age: 57 y.o. MRN: [id number]  Chief Complaint  Patient presents with   Acute Visit    Med Refills & R Knee Pain    Patient in office for an acute visit, complaining of right knee pain. Patient reports knee pain started about 7-10 days ago. Has tried voltaren gel and heating pad with no relief. Ibuprofen provides temporary relief. Will order an xray of the right knee to rule out acute changes.  Discussed recent lab results. LDL improved. Hgb A1c improved. Alkaline phosphate elevated, will recheck at next visit.   Knee Pain  The incident occurred more than 1 week ago. There was no injury mechanism. The pain is present in the right knee. The quality of the pain is described as burning and shooting. The pain is at a severity of 9/10. The pain is severe. The pain has been Constant since onset. Associated symptoms include an inability to bear weight. Pertinent negatives include no numbness or tingling. She reports no foreign bodies present. The symptoms are aggravated by weight bearing and movement. She has tried NSAIDs, non-weight bearing, rest and heat (Voltaren) for the symptoms. The treatment provided no relief.    No other concerns at this time.   Past Medical History:  Diagnosis Date   Anemia    Chest pain 06/25/2018   Hypertension     Past Surgical History:  Procedure Laterality Date   ABDOMINAL HYSTERECTOMY     ANKLE FUSION Right 07/07/2016   Procedure: ARTHRODESIS ANKLE;  Surgeon: Gwyneth Revels, DPM;  Location: [facility];  Service: Podiatry;  Laterality: Right;   FLAT FOOT RECONSTRUCTION-TAL GASTROC RECESSION Right 07/07/2016   Procedure: FLAT FOOT RECONSTRUCTION-TAL GASTROC RECESSION;  Surgeon: Gwyneth Revels, DPM;  Location: [facility];  Service: Podiatry;  Laterality: Right;   HYSTEROSCOPY WITH D & C  06/11/2015   Procedure: DILATATION AND CURETTAGE /HYSTEROSCOPY;  Surgeon:  Elenora Fender Ward, MD;  Location: [facility];  Service: Gynecology;;   LAPAROSCOPIC HYSTERECTOMY N/A 07/06/2015   Procedure: HYSTERECTOMY TOTAL LAPAROSCOPIC;  Surgeon: Elenora Fender Ward, MD;  Location: [facility];  Service: Gynecology;  Laterality: N/A;   LAPAROSCOPIC SALPINGO OOPHERECTOMY Bilateral 07/06/2015   Procedure: LAPAROSCOPIC SALPINGO OOPHORECTOMY;  Surgeon: Elenora Fender Ward, MD;  Location: [facility];  Service: Gynecology;  Laterality: Bilateral;   SYNOVECTOMY  07/07/2016   Procedure: Posterior tibial tendon tenosynovectomy;  Surgeon: Gwyneth Revels, DPM;  Location: [facility];  Service: Podiatry;;   TRIPLE SUBTALAR FUSION Right 07/07/2016   Procedure: TRIPLE SUBTALAR FUSION;  Surgeon: Gwyneth Revels, DPM;  Location: [facility];  Service: Podiatry;  Laterality: Right;   TUBAL LIGATION  1987    Social History   Socioeconomic History   Marital status: Single    Spouse name: Not on file   Number of children: Not on file   Years of education: Not on file   Highest education level: Not on file  Occupational History   Not on file  Tobacco Use   Smoking status: Some Days    Current packs/day: 0.00    Average packs/day: 2.0 packs/day for 30.0 years (60.0 ttl pk-yrs)    Types: Cigarettes    Start date: 03/08/1985    Last attempt to quit: 03/09/2015    Years since quitting: 9.0   Smokeless tobacco: Never  Substance and Sexual Activity   Alcohol use: No   Drug use: No   Sexual activity: Not  Currently  Other Topics Concern   Not on file  Social History Narrative   Not on file   Social Drivers of Health   Financial Resource Strain: Not on file  Food Insecurity: Not on file  Transportation Needs: Not on file  Physical Activity: Not on file  Stress: Not on file  Social Connections: Unknown (04/17/2022)   [facility]   Social Network    Social Network: Not on file  Intimate Partner Violence: Unknown (03/09/2022)   Received from [facility]   HITS     Physically Hurt: Not on file    Insult or Talk Down To: Not on file    Threaten Physical Harm: Not on file    Scream or Curse: Not on file    History reviewed. No pertinent family history.  Allergies  Allergen Reactions   Latex Rash    SENSITIVITY. Found out during gyn appt    Outpatient Medications Prior to Visit  Medication Sig   hydrochlorothiazide (HYDRODIURIL) 12.5 MG tablet Take 1 tablet (12.5 mg total) by mouth every morning.   losartan (COZAAR) 50 MG tablet Take 1 tablet (50 mg total) by mouth every morning.   metoprolol tartrate (LOPRESSOR) 50 MG tablet Take 1 tablet (50 mg total) by mouth daily.   nicotine (NICODERM CQ - DOSED IN MG/24 HOURS) 21 mg/24hr patch Place 1 patch (21 mg total) onto the skin daily.   rosuvastatin (CRESTOR) 10 MG tablet Take 1 tablet (10 mg total) by mouth daily.   Semaglutide-Weight Management (WEGOVY) 0.25 MG/0.5ML SOAJ Inject 0.25 mg into the skin once a week.   [DISCONTINUED] famotidine (PEPCID) 20 MG tablet Take 1 tablet (20 mg total) by mouth daily.   [DISCONTINUED] ibuprofen (ADVIL) 800 MG tablet TAKE 1 TABLET BY MOUTH EVERY 8 HOURS AS NEEDED   No facility-administered medications prior to visit.    Review of Systems  Constitutional: Negative.   HENT: Negative.    Eyes: Negative.   Respiratory: Negative.  Negative for shortness of breath.   Cardiovascular: Negative.  Negative for chest pain.  Gastrointestinal: Negative.  Negative for abdominal pain, constipation and diarrhea.  Genitourinary: Negative.   Musculoskeletal:  Positive for joint pain. Negative for myalgias.  Skin: Negative.   Neurological: Negative.  Negative for dizziness, tingling, numbness and headaches.  Endo/Heme/Allergies: Negative.   All other systems reviewed and are negative.      Objective:   BP 108/68   Pulse 71   Ht 5\' 7"  (1.702 m)   Wt 253 lb (114.8 kg)   LMP 06/28/2015 Comment: upreg neg  SpO2 97%   BMI 39.63 kg/m   Vitals:   03/07/24 1001   BP: 108/68  Pulse: 71  Height: 5\' 7"  (1.702 m)  Weight: 253 lb (114.8 kg)  SpO2: 97%  BMI (Calculated): 39.62    Physical Exam Vitals and nursing note reviewed.  Constitutional:      Appearance: Normal appearance. She is normal weight.  HENT:     Head: Normocephalic and atraumatic.     Nose: Nose normal.     Mouth/Throat:     Mouth: Mucous membranes are moist.  Eyes:     Extraocular Movements: Extraocular movements intact.     Conjunctiva/sclera: Conjunctivae normal.     Pupils: Pupils are equal, round, and reactive to light.  Cardiovascular:     Rate and Rhythm: Normal rate and regular rhythm.     Pulses: Normal pulses.  Heart sounds: Normal heart sounds.  Pulmonary:     Effort: Pulmonary effort is normal.     Breath sounds: Normal breath sounds.  Abdominal:     General: Abdomen is flat. Bowel sounds are normal.     Palpations: Abdomen is soft.  Musculoskeletal:        General: Normal range of motion.     Cervical back: Normal range of motion.  Skin:    General: Skin is warm and dry.  Neurological:     General: No focal deficit present.     Mental Status: She is alert and oriented to person, place, and time.  Psychiatric:        Mood and Affect: Mood normal.        Behavior: Behavior normal.        Thought Content: Thought content normal.        Judgment: Judgment normal.      No results found for any visits on 03/07/24.  Recent Results (from the past 2160 hours)  Hemoglobin A1c     Status: Abnormal   Collection Time: 02/12/24  9:27 AM  Result Value Ref Range   Hgb A1c MFr Bld 5.9 (H) 4.8 - 5.6 %    Comment:          Prediabetes: 5.7 - 6.4          Diabetes: >6.4          Glycemic control for adults with diabetes: <7.0    Est. average glucose Bld gHb Est-mCnc 123 mg/dL  TSH     Status: None   Collection Time: 02/12/24  9:27 AM  Result Value Ref Range   TSH 1.540 0.450 - 4.500 uIU/mL  CMP14+EGFR     Status: Abnormal   Collection Time: 02/12/24   9:27 AM  Result Value Ref Range   Glucose 97 70 - 99 mg/dL   BUN 19 6 - 24 mg/dL   Creatinine, Ser 1.61 0.57 - 1.00 mg/dL   eGFR 73 >09 UE/AVW/0.98   BUN/Creatinine Ratio 21 9 - 23   Sodium 144 134 - 144 mmol/L   Potassium 4.1 3.5 - 5.2 mmol/L   Chloride 102 96 - 106 mmol/L   CO2 23 20 - 29 mmol/L   Calcium 9.7 8.7 - 10.2 mg/dL   Total Protein 6.9 6.0 - 8.5 g/dL   Albumin 4.2 3.8 - 4.9 g/dL   Globulin, Total 2.7 1.5 - 4.5 g/dL   Bilirubin Total 0.4 0.0 - 1.2 mg/dL   Alkaline Phosphatase 149 (H) 44 - 121 IU/L   AST 13 0 - 40 IU/L   ALT 18 0 - 32 IU/L  Lipid panel     Status: None   Collection Time: 02/12/24  9:27 AM  Result Value Ref Range   Cholesterol, Total 131 100 - 199 mg/dL   Triglycerides 67 0 - 149 mg/dL   HDL 59 >11 mg/dL   VLDL Cholesterol Cal 14 5 - 40 mg/dL   LDL Chol Calc (NIH) 58 0 - 99 mg/dL   Chol/HDL Ratio 2.2 0.0 - 4.4 ratio    Comment:                                   T. Chol/HDL Ratio  Men  Women                               1/2 Avg.Risk  3.4    3.3                                   Avg.Risk  5.0    4.4                                2X Avg.Risk  9.6    7.1                                3X Avg.Risk 23.4   11.0       Assessment & Plan:  Right knee xray. Continue diet and exercise to lower Hgb A1c and LDL.   Problem List Items Addressed This Visit       Other   Acute pain of right knee - Primary   Relevant Orders   DG Knee Complete 4 Views Right    Return in about 4 months (around 07/07/2024) for with fasting labs prior.   Total time spent: 25 minutes  [employer], NP  03/07/2024   This document may have been prepared by Dragon Voice Recognition software and as such may include unintentional dictation errors.

## 2024-03-10 ENCOUNTER — Other Ambulatory Visit: Payer: Self-pay | Admitting: Cardiology

## 2024-03-10 DIAGNOSIS — M25561 Pain in right knee: Secondary | ICD-10-CM

## 2024-03-13 ENCOUNTER — Telehealth: Payer: Self-pay

## 2024-03-13 NOTE — Telephone Encounter (Signed)
 Patient Melissa Sparks 03/12/24 at 10:36a on my work phone she is asking about her referral for ortho and if an appt was made, she says she's still in pain with the bone spurs.

## 2024-04-09 ENCOUNTER — Telehealth: Payer: Self-pay | Admitting: Cardiology

## 2024-04-09 NOTE — Telephone Encounter (Signed)
 Patient called in. She went to see ortho today and they told her that she has severe arthritis in her right knee. She cannot tolerate meloxicam and OTC meds aren't helping. She wants a referral to pain management.  She also said that you mentioned writing an Rx for diclofenac cream. Can you send a Rx for that to [facility] on [address]?

## 2024-04-10 ENCOUNTER — Other Ambulatory Visit: Payer: Self-pay | Admitting: Cardiology

## 2024-04-10 DIAGNOSIS — M1990 Unspecified osteoarthritis, unspecified site: Secondary | ICD-10-CM | POA: Insufficient documentation

## 2024-04-10 MED ORDER — DICLOFENAC SODIUM 1 % EX GEL: 4.0000 g | Freq: Four times a day (QID) | CUTANEOUS | 2 refills | Status: AC

## 2024-06-29 ENCOUNTER — Other Ambulatory Visit: Payer: Self-pay | Admitting: Cardiology

## 2024-07-11 ENCOUNTER — Ambulatory Visit: Admitting: Cardiology

## 2024-08-04 ENCOUNTER — Other Ambulatory Visit: Payer: Self-pay | Admitting: Cardiology

## 2024-09-07 ENCOUNTER — Other Ambulatory Visit: Payer: Self-pay | Admitting: Cardiology

## 2024-10-08 ENCOUNTER — Ambulatory Visit: Admitting: Cardiology

## 2024-10-09 ENCOUNTER — Encounter: Payer: Self-pay | Admitting: Cardiology

## 2024-10-09 ENCOUNTER — Ambulatory Visit: Admitting: Cardiology

## 2024-10-09 VITALS — BP 114/82 | HR 83 | Ht 67.0 in | Wt 266.4 lb

## 2024-10-09 DIAGNOSIS — I10 Essential (primary) hypertension: Secondary | ICD-10-CM

## 2024-10-09 DIAGNOSIS — Z6841 Body Mass Index (BMI) 40.0 and over, adult: Secondary | ICD-10-CM | POA: Insufficient documentation

## 2024-10-09 DIAGNOSIS — F17210 Nicotine dependence, cigarettes, uncomplicated: Secondary | ICD-10-CM | POA: Diagnosis not present

## 2024-10-09 DIAGNOSIS — E782 Mixed hyperlipidemia: Secondary | ICD-10-CM | POA: Diagnosis not present

## 2024-10-09 DIAGNOSIS — E66813 Obesity, class 3: Secondary | ICD-10-CM | POA: Diagnosis not present

## 2024-10-09 DIAGNOSIS — Z131 Encounter for screening for diabetes mellitus: Secondary | ICD-10-CM

## 2024-10-09 DIAGNOSIS — Z1231 Encounter for screening mammogram for malignant neoplasm of breast: Secondary | ICD-10-CM

## 2024-10-09 DIAGNOSIS — Z1329 Encounter for screening for other suspected endocrine disorder: Secondary | ICD-10-CM

## 2024-10-09 DIAGNOSIS — Z713 Dietary counseling and surveillance: Secondary | ICD-10-CM | POA: Insufficient documentation

## 2024-10-09 MED ORDER — NICOTINE 21 MG/24HR TD PT24: 21.0000 mg | MEDICATED_PATCH | TRANSDERMAL | 1 refills | Status: AC

## 2024-10-09 MED ORDER — ROSUVASTATIN CALCIUM 10 MG PO TABS: 10.0000 mg | ORAL_TABLET | Freq: Every day | ORAL | 1 refills | Status: AC

## 2024-10-09 MED ORDER — HYDROCHLOROTHIAZIDE 12.5 MG PO TABS: 12.5000 mg | ORAL_TABLET | Freq: Every morning | ORAL | 1 refills | Status: AC

## 2024-10-09 MED ORDER — METOPROLOL TARTRATE 50 MG PO TABS: 50.0000 mg | ORAL_TABLET | Freq: Every day | ORAL | 1 refills | Status: AC

## 2024-10-09 MED ORDER — FAMOTIDINE 20 MG PO TABS: 20.0000 mg | ORAL_TABLET | Freq: Every day | ORAL | 1 refills | Status: AC

## 2024-10-09 MED ORDER — LOSARTAN POTASSIUM 50 MG PO TABS: 50.0000 mg | ORAL_TABLET | Freq: Every morning | ORAL | 1 refills | Status: AC

## 2024-10-09 NOTE — Progress Notes (Signed)
 Established Patient Office Visit  Subjective:  Patient ID: Melissa Sparks, female    DOB: [birth year]  Age: 58 y.o. MRN: [id number]  Chief Complaint  Patient presents with   Follow-up    Follow up     Patient in office for routine follow up, did not have fasting lab work done. Patient doing well, no new complaints today.  Seeing orthopaedics at [facility] for knee and shoulder pain. Insurance will not pay for Wegovy . Patient requesting phentermine. The patient is asked to make an attempt to improve diet and exercise patterns to aid in medical management of this problem. Due for mammogram, order placed. Due for colon cancer screening, patient refuses.  Patient will return for fasting lab work.  Discussed smoking cessation, will refill nicotine  patches.  Blood pressure well controlled.  Continue current medications.     No other concerns at this time.   Past Medical History:  Diagnosis Date   Anemia    Chest pain 06/25/2018   Hypertension     Past Surgical History:  Procedure Laterality Date   ABDOMINAL HYSTERECTOMY     ANKLE FUSION Right 07/07/2016   Procedure: ARTHRODESIS ANKLE;  Surgeon: Eva Gay, DPM;  Location: [facility];  Service: Podiatry;  Laterality: Right;   FLAT FOOT RECONSTRUCTION-TAL GASTROC RECESSION Right 07/07/2016   Procedure: FLAT FOOT RECONSTRUCTION-TAL GASTROC RECESSION;  Surgeon: Eva Gay, DPM;  Location: [facility];  Service: Podiatry;  Laterality: Right;   HYSTEROSCOPY WITH D & C  06/11/2015   Procedure: DILATATION AND CURETTAGE /HYSTEROSCOPY;  Surgeon: Mitzie BROCKS Ward, MD;  Location: [facility];  Service: Gynecology;;   LAPAROSCOPIC HYSTERECTOMY N/A 07/06/2015   Procedure: HYSTERECTOMY TOTAL LAPAROSCOPIC;  Surgeon: Mitzie BROCKS Ward, MD;  Location: [facility];  Service: Gynecology;  Laterality: N/A;   LAPAROSCOPIC SALPINGO OOPHERECTOMY Bilateral 07/06/2015   Procedure: LAPAROSCOPIC SALPINGO OOPHORECTOMY;  Surgeon: Mitzie BROCKS Ward, MD;  Location: [facility];  Service:  Gynecology;  Laterality: Bilateral;   SYNOVECTOMY  07/07/2016   Procedure: Posterior tibial tendon tenosynovectomy;  Surgeon: Eva Gay, DPM;  Location: [facility];  Service: Podiatry;;   TRIPLE SUBTALAR FUSION Right 07/07/2016   Procedure: TRIPLE SUBTALAR FUSION;  Surgeon: Eva Gay, DPM;  Location: [facility];  Service: Podiatry;  Laterality: Right;   TUBAL LIGATION  1987    Social History   Socioeconomic History   Marital status: Single    Spouse name: Not on file   Number of children: Not on file   Years of education: Not on file   Highest education level: Not on file  Occupational History   Not on file  Tobacco Use   Smoking status: Some Days    Current packs/day: 0.00    Average packs/day: 2.0 packs/day for 30.0 years (60.0 ttl pk-yrs)    Types: Cigarettes    Start date: 03/08/1985    Last attempt to quit: 03/09/2015    Years since quitting: 9.5   Smokeless tobacco: Never  Substance and Sexual Activity   Alcohol use: No   Drug use: No   Sexual activity: Not Currently  Other Topics Concern   Not on file  Social History Narrative   Not on file   Social Drivers of Health   Financial Resource Strain: Low Risk  (09/11/2024)   Received from [facility]   Overall Financial Resource Strain (CARDIA)    Difficulty of Paying Living Expenses: Not hard at all  Food Insecurity: No Food Insecurity (09/11/2024)   Received from [facility]   Hunger Vital Sign    Within the past 12 months, you worried that your food would run out before you got the money to buy more.: Never true    Within the past 12 months, the food you bought just didn't last and you didn't have money to get more.: Never true  Transportation Needs: No Transportation Needs (09/11/2024)   Received from [facility]    In the past 12 months, has lack of transportation kept you from medical appointments or from getting medications?: No     Lack of Transportation (Non-Medical): No  Physical Activity: Not on file  Stress: Not on file  Social Connections: Unknown (04/17/2022)   Received from [facility]   Social Network    Social Network: Not on file  Intimate Partner Violence: Unknown (03/09/2022)   Received from [facility]   HITS    Physically Hurt: Not on file    Insult or Talk Down To: Not on file    Threaten Physical Harm: Not on file    Scream or Curse: Not on file    History reviewed. No pertinent family history.  Allergies  Allergen Reactions   Latex Rash    SENSITIVITY. Found out during gyn appt    Outpatient Medications Prior to Visit  Medication Sig   celecoxib (CELEBREX) 200 MG capsule Take 200 mg by mouth daily.   diclofenac  Sodium (VOLTAREN ) 1 % GEL Apply 4 g topically 4 (four) times daily.   ibuprofen  (ADVIL ) 800 MG tablet TAKE 1 TABLET BY MOUTH EVERY 8 HOURS AS NEEDED   [DISCONTINUED] famotidine  (PEPCID ) 20 MG tablet Take 1 tablet (20 mg total) by mouth daily.   [DISCONTINUED] hydrochlorothiazide  (HYDRODIURIL ) 12.5 MG tablet TAKE 1 TABLET BY MOUTH IN THE MORNING   [DISCONTINUED] losartan  (COZAAR ) 50 MG tablet TAKE 1 TABLET BY MOUTH IN THE MORNING   [DISCONTINUED] metoprolol  tartrate (LOPRESSOR ) 50 MG tablet Take 1 tablet (50 mg total) by mouth daily.   [DISCONTINUED] nicotine  (NICODERM CQ  - DOSED IN MG/24 HOURS) 21 mg/24hr patch Place 1 patch (21 mg total) onto the skin daily.   [DISCONTINUED] rosuvastatin  (CRESTOR ) 10 MG tablet Take 1 tablet (10 mg total) by mouth daily.   [DISCONTINUED] Semaglutide -Weight Management (WEGOVY ) 0.25 MG/0.5ML SOAJ Inject 0.25 mg into the skin once a week. (Patient not taking: Reported on 10/09/2024)   No facility-administered medications prior to visit.    Review of Systems  Constitutional: Negative.   HENT: Negative.    Eyes: Negative.   Respiratory: Negative.  Negative for shortness of breath.   Cardiovascular: Negative.  Negative for chest pain.   Gastrointestinal: Negative.  Negative for abdominal pain, constipation and diarrhea.  Genitourinary: Negative.   Musculoskeletal:  Positive for joint pain. Negative for myalgias.  Skin: Negative.   Neurological: Negative.  Negative for dizziness and headaches.  Endo/Heme/Allergies: Negative.   All other systems reviewed and are negative.      Objective:   BP 114/82   Pulse 83   Ht 5' 7 (1.702 m)   Wt 266 lb 6.4 oz (120.8 kg)   LMP 06/28/2015 Comment: upreg neg  SpO2 96%   BMI 41.72 kg/m   Vitals:   10/09/24 0947  BP: 114/82  Pulse: 83  Height: 5' 7 (1.702 m)  Weight: 266 lb 6.4 oz (120.8 kg)  SpO2: 96%  BMI (Calculated): 41.71    Physical Exam Vitals and nursing note reviewed.  Constitutional:  Appearance: Normal appearance. She is normal weight.  HENT:     Head: Normocephalic and atraumatic.     Nose: Nose normal.     Mouth/Throat:     Mouth: Mucous membranes are moist.  Eyes:     Extraocular Movements: Extraocular movements intact.     Conjunctiva/sclera: Conjunctivae normal.     Pupils: Pupils are equal, round, and reactive to light.  Cardiovascular:     Rate and Rhythm: Normal rate and regular rhythm.     Pulses: Normal pulses.     Heart sounds: Normal heart sounds.  Pulmonary:     Effort: Pulmonary effort is normal.     Breath sounds: Normal breath sounds.  Abdominal:     General: Abdomen is flat. Bowel sounds are normal.     Palpations: Abdomen is soft.  Musculoskeletal:        General: Normal range of motion.     Cervical back: Normal range of motion.  Skin:    General: Skin is warm and dry.  Neurological:     General: No focal deficit present.     Mental Status: She is alert and oriented to person, place, and time.  Psychiatric:        Mood and Affect: Mood normal.        Behavior: Behavior normal.        Thought Content: Thought content normal.        Judgment: Judgment normal.      No results found for any visits on  10/09/24.  No results found for this or any previous visit (from the past 2160 hours).    Assessment & Plan:  Phentermine The patient is asked to make an attempt to improve diet and exercise patterns to aid in medical management of this problem. Mammogram order placed Return for fasting lab work Smoking cessation Continue current medications  Problem List Items Addressed This Visit       Cardiovascular and Mediastinum   Primary hypertension - Primary   Relevant Medications   rosuvastatin  (CRESTOR ) 10 MG tablet   metoprolol  tartrate (LOPRESSOR ) 50 MG tablet   losartan  (COZAAR ) 50 MG tablet   hydrochlorothiazide  (HYDRODIURIL ) 12.5 MG tablet   Other Relevant Orders   CMP14+EGFR     Other   Mixed hyperlipidemia   Relevant Medications   rosuvastatin  (CRESTOR ) 10 MG tablet   metoprolol  tartrate (LOPRESSOR ) 50 MG tablet   losartan  (COZAAR ) 50 MG tablet   hydrochlorothiazide  (HYDRODIURIL ) 12.5 MG tablet   Other Relevant Orders   Lipid panel   Cigarette smoker   Class 3 severe obesity due to excess calories with serious comorbidity and body mass index (BMI) of 40.0 to 44.9 in adult [facility])   Relevant Orders   Hemoglobin A1c   Weight loss counseling, encounter for   Other Visit Diagnoses       Diabetes mellitus screening       Relevant Orders   Hemoglobin A1c     Thyroid disorder screening       Relevant Orders   TSH     Breast cancer screening by mammogram       Relevant Orders   MM 3D SCREENING MAMMOGRAM BILATERAL BREAST       Return in about 4 weeks (around 11/06/2024).   Total time spent: 25 minutes. This time includes review of previous notes and results and patient face to face interaction during today's visit.    Jeoffrey Pollen, NP  10/09/2024   This document may have  been prepared by [employer] and as such may include unintentional dictation errors.

## 2024-10-13 ENCOUNTER — Telehealth: Payer: Self-pay

## 2024-10-13 NOTE — Telephone Encounter (Signed)
 Patient Melissa Sparks stating that she wante to make sure you called everything into the pharmacy for hr she states she thought you were sending her in a controlled substance but did not state what it was for

## 2024-10-14 ENCOUNTER — Other Ambulatory Visit: Payer: Self-pay

## 2024-10-14 MED ORDER — PHENTERMINE HCL 37.5 MG PO TABS: 37.5000 mg | ORAL_TABLET | Freq: Every day | ORAL | 0 refills | Status: AC

## 2024-10-14 NOTE — Telephone Encounter (Signed)
 Sent request to [facility] for rx fill

## 2024-11-06 ENCOUNTER — Ambulatory Visit: Admitting: Cardiology
